# Patient Record
Sex: Male | Born: 1985 | Race: White | Hispanic: No | Marital: Married | Smoking: Never smoker
Health system: Southern US, Community
[De-identification: ages and names within clinical notes are randomized; demographics above are authoritative.]

## PROBLEM LIST (undated history)

## (undated) DIAGNOSIS — I2699 Other pulmonary embolism without acute cor pulmonale: Secondary | ICD-10-CM

## (undated) DIAGNOSIS — L03119 Cellulitis of unspecified part of limb: Secondary | ICD-10-CM

## (undated) DIAGNOSIS — G919 Hydrocephalus, unspecified: Secondary | ICD-10-CM

## (undated) DIAGNOSIS — R32 Unspecified urinary incontinence: Secondary | ICD-10-CM

## (undated) DIAGNOSIS — Q059 Spina bifida, unspecified: Secondary | ICD-10-CM

## (undated) DIAGNOSIS — Z87440 Personal history of urinary (tract) infections: Secondary | ICD-10-CM

## (undated) HISTORY — DX: Other pulmonary embolism without acute cor pulmonale: I26.99

## (undated) HISTORY — DX: Unspecified urinary incontinence: R32

## (undated) HISTORY — DX: Personal history of urinary (tract) infections: Z87.440

## (undated) HISTORY — PX: LEG SURGERY: SHX1003

## (undated) HISTORY — DX: Cellulitis of unspecified part of limb: L03.119

## (undated) HISTORY — PX: VENTRICULOPERITONEAL SHUNT: SHX204

---
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
## 2005-02-04 IMAGING — CT CT HEAD W/O CM
1 series · 16 of 30 positions shown, 20 images · non-contrast
Comparison: none

CLINICAL DATA: Follow up neurogenic bladder.  Spina bifida.  Ventricular shunt. 
 CT HEAD W/O CONTRAST
 Axial scans from the base to the vertex were performed in the unenhanced state and compared to the most recent study of 02/24/02.  There has been no change in configuration of the ventricles which are decompressed with the ventricular shunt catheter remaining.  Stable changes of partial agenesis and Chiari II malformation are again noted.  No mass effect is seen and no acute abnormality is evident.  No acute bony abnormality is seen. 
 IMPRESSION
 No change [REDACTED]ompression of ventricles with ventricular shunt catheter present.  This appearance is stable compared to the prior CT of 02/24/02.  No acute intracranial abnormality is seen.

[Series 2: brain · axial · 0.49mm/px · z∈[+160,+304]mm · 16 of 32 slices shown, 20 images]
[im 2/32  brain]
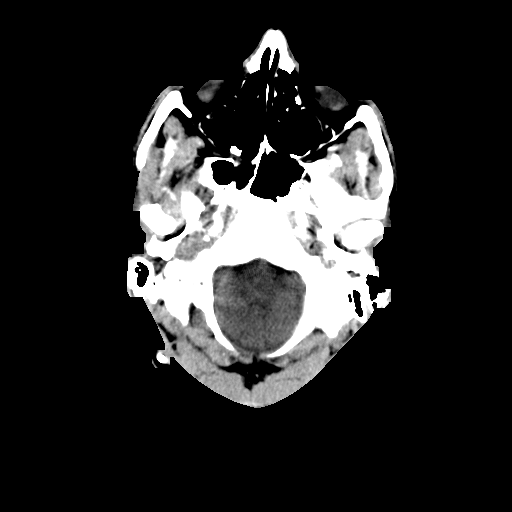
[im 2/32  bone]
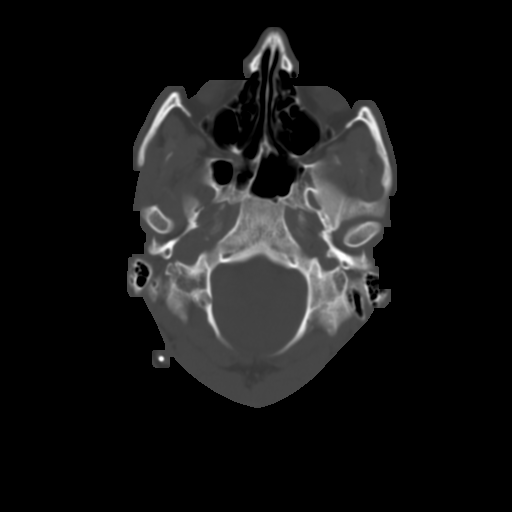
[im 4/32  brain]
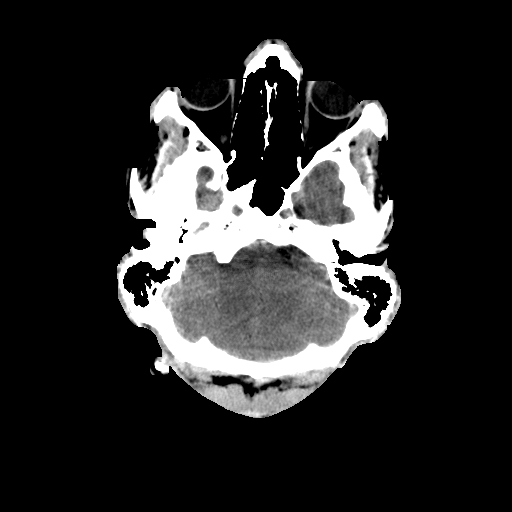
[im 6/32  brain]
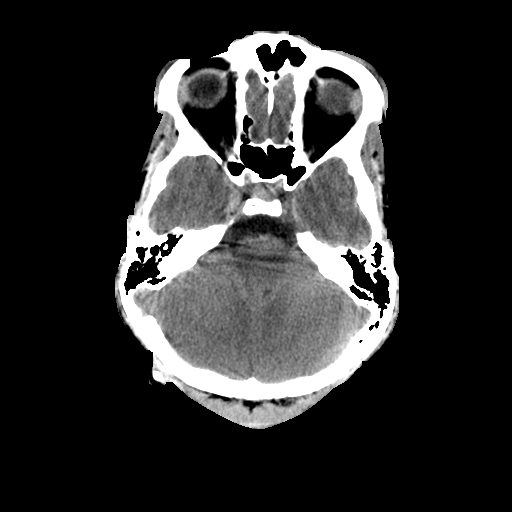
[im 8/32  brain]
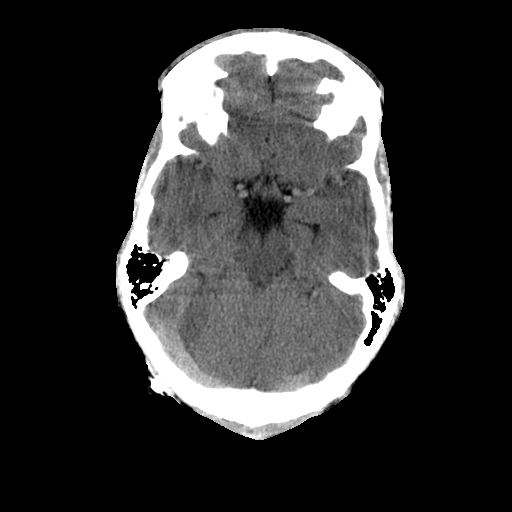
[im 9/32  brain]
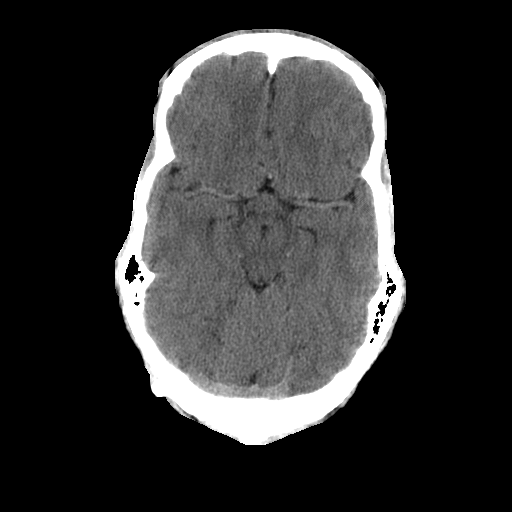
[im 9/32  bone]
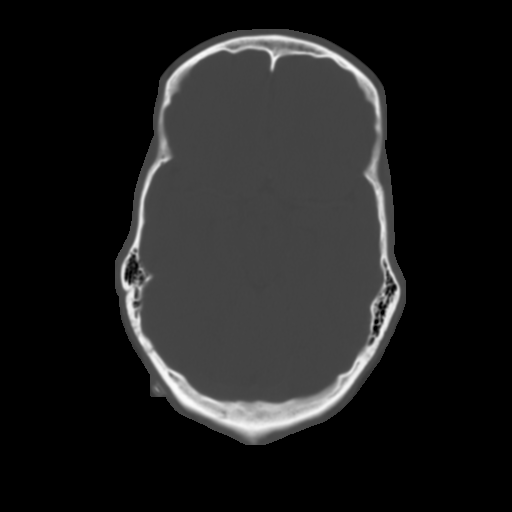
[im 11/32  brain]
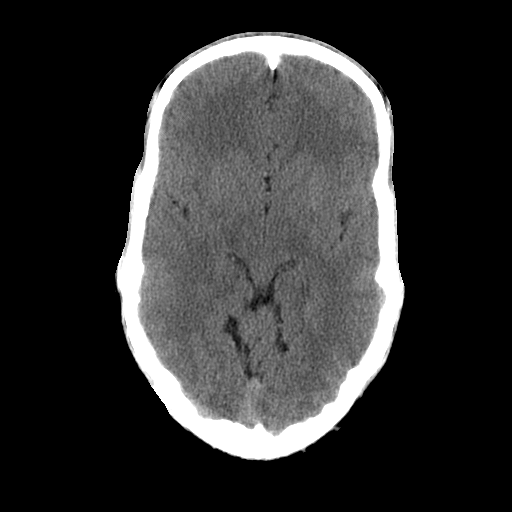
[im 13/32  brain]
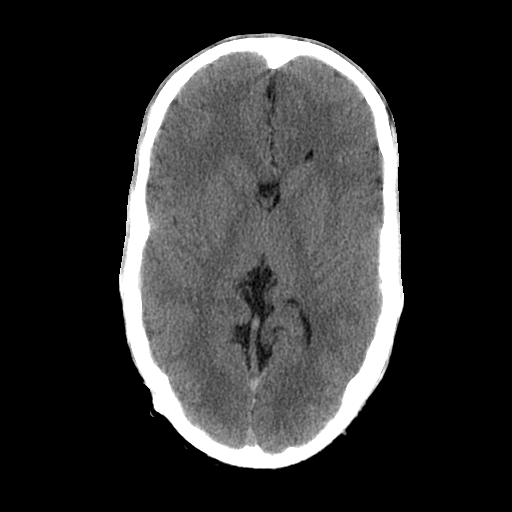
[im 15/32  brain]
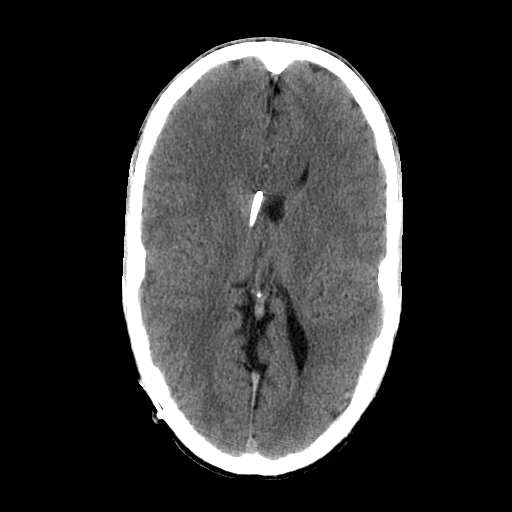
[im 17/32  brain]
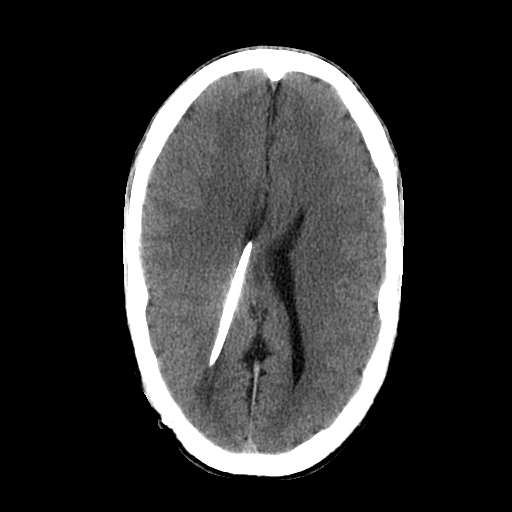
[im 17/32  bone]
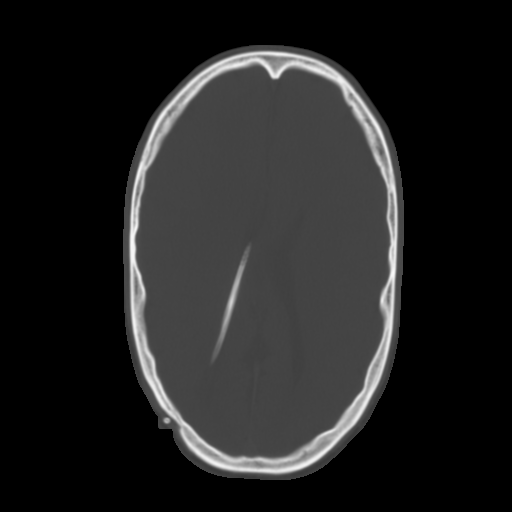
[im 19/32  brain]
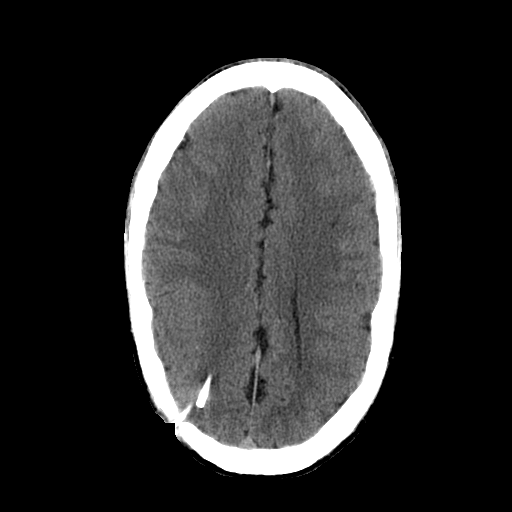
[im 21/32  brain]
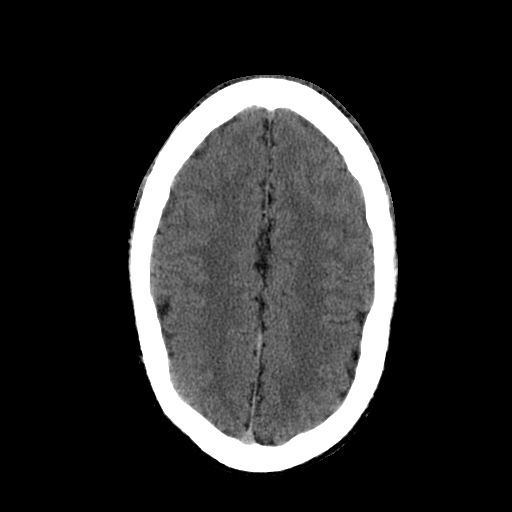
[im 23/32  brain]
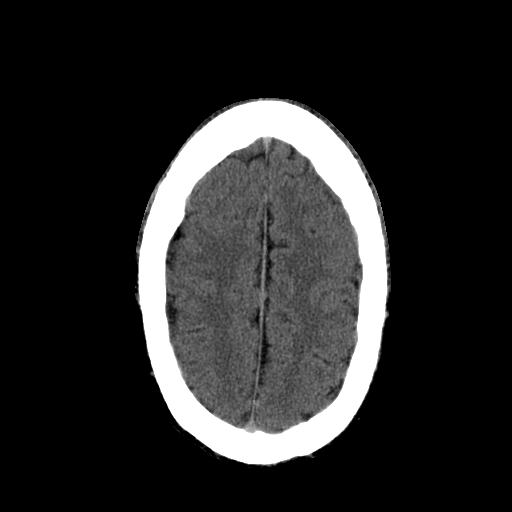
[im 24/32  brain]
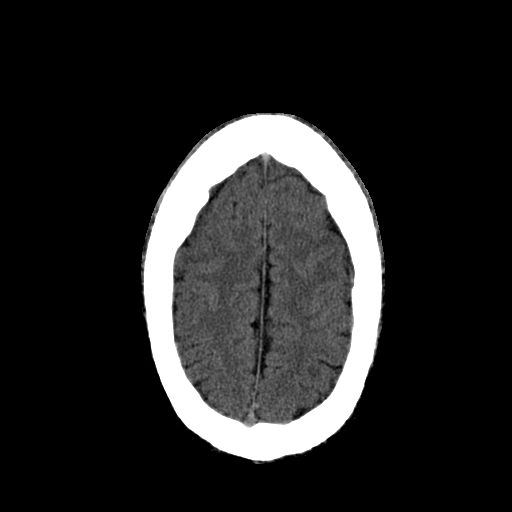
[im 24/32  bone]
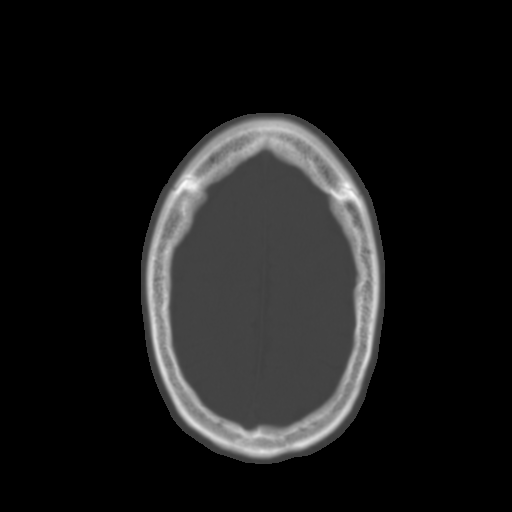
[im 26/32  brain]
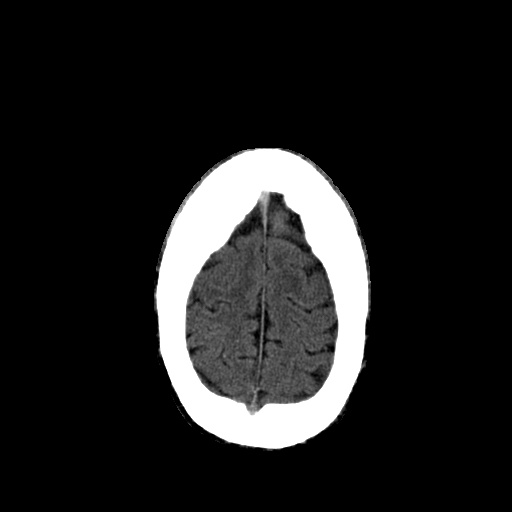
[im 28/32  brain]
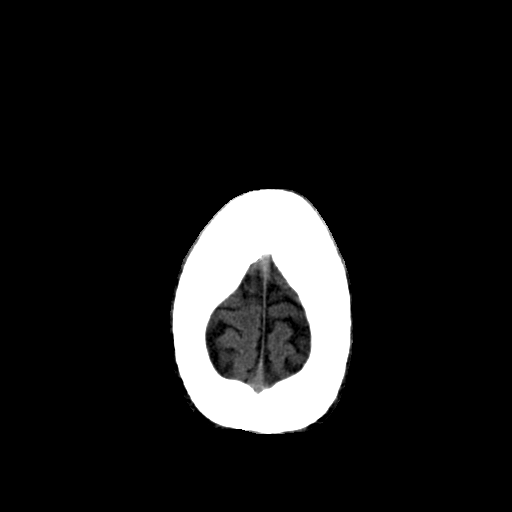
[im 30/32  brain]
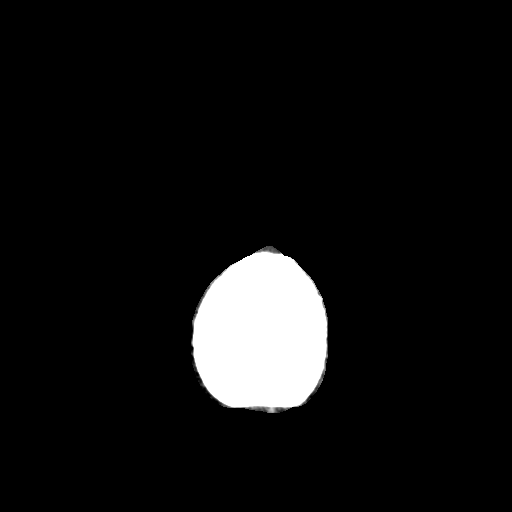

[16 of 30 positions shown; findings below may reference images not displayed]

## 2016-04-17 ENCOUNTER — Encounter: Payer: Self-pay | Admitting: Emergency Medicine

## 2016-04-17 ENCOUNTER — Inpatient Hospital Stay
Admission: EM | Admit: 2016-04-17 | Discharge: 2016-04-21 | DRG: 872 | Disposition: A | Discharge status: Other Acute Inpatient Hospital | Payer: BC Managed Care – PPO | Attending: Internal Medicine | Admitting: Internal Medicine

## 2016-04-17 ENCOUNTER — Emergency Department: Payer: BC Managed Care – PPO

## 2016-04-17 DIAGNOSIS — E871 Hypo-osmolality and hyponatremia: Secondary | ICD-10-CM | POA: Diagnosis present

## 2016-04-17 DIAGNOSIS — S81812A Laceration without foreign body, left lower leg, initial encounter: Secondary | ICD-10-CM | POA: Diagnosis present

## 2016-04-17 DIAGNOSIS — B9561 Methicillin susceptible Staphylococcus aureus infection as the cause of diseases classified elsewhere: Secondary | ICD-10-CM | POA: Diagnosis present

## 2016-04-17 DIAGNOSIS — L02416 Cutaneous abscess of left lower limb: Secondary | ICD-10-CM | POA: Diagnosis present

## 2016-04-17 DIAGNOSIS — E860 Dehydration: Secondary | ICD-10-CM | POA: Diagnosis present

## 2016-04-17 DIAGNOSIS — Q054 Unspecified spina bifida with hydrocephalus: Secondary | ICD-10-CM | POA: Diagnosis not present

## 2016-04-17 DIAGNOSIS — E876 Hypokalemia: Secondary | ICD-10-CM | POA: Diagnosis present

## 2016-04-17 DIAGNOSIS — S82409A Unspecified fracture of shaft of unspecified fibula, initial encounter for closed fracture: Secondary | ICD-10-CM | POA: Diagnosis present

## 2016-04-17 DIAGNOSIS — X58XXXA Exposure to other specified factors, initial encounter: Secondary | ICD-10-CM | POA: Diagnosis present

## 2016-04-17 DIAGNOSIS — Z882 Allergy status to sulfonamides status: Secondary | ICD-10-CM | POA: Diagnosis not present

## 2016-04-17 DIAGNOSIS — L03116 Cellulitis of left lower limb: Secondary | ICD-10-CM

## 2016-04-17 DIAGNOSIS — G629 Polyneuropathy, unspecified: Secondary | ICD-10-CM | POA: Diagnosis present

## 2016-04-17 DIAGNOSIS — A419 Sepsis, unspecified organism: Secondary | ICD-10-CM | POA: Diagnosis not present

## 2016-04-17 DIAGNOSIS — Z982 Presence of cerebrospinal fluid drainage device: Secondary | ICD-10-CM | POA: Diagnosis not present

## 2016-04-17 DIAGNOSIS — L97511 Non-pressure chronic ulcer of other part of right foot limited to breakdown of skin: Secondary | ICD-10-CM | POA: Diagnosis present

## 2016-04-17 DIAGNOSIS — M7989 Other specified soft tissue disorders: Secondary | ICD-10-CM | POA: Diagnosis not present

## 2016-04-17 DIAGNOSIS — N289 Disorder of kidney and ureter, unspecified: Secondary | ICD-10-CM | POA: Diagnosis present

## 2016-04-17 HISTORY — DX: Cellulitis of left lower limb: L03.116

## 2016-04-17 HISTORY — DX: Hydrocephalus, unspecified: G91.9

## 2016-04-17 HISTORY — DX: Spina bifida, unspecified: Q05.9

## 2016-04-17 LAB — CBC WITH DIFFERENTIAL/PLATELET
BASOS ABS: 0.1 10*3/uL (ref 0–0.1)
Basophils Relative: 0 %
EOS ABS: 0 10*3/uL (ref 0–0.7)
Eosinophils Relative: 0 %
HCT: 40.8 % (ref 40.0–52.0)
HEMOGLOBIN: 14 g/dL (ref 13.0–18.0)
LYMPHS ABS: 0.4 10*3/uL — AB (ref 1.0–3.6)
LYMPHS PCT: 1 %
MCH: 29.5 pg (ref 26.0–34.0)
MCHC: 34.2 g/dL (ref 32.0–36.0)
MCV: 86.3 fL (ref 80.0–100.0)
Monocytes Absolute: 1.3 10*3/uL — ABNORMAL HIGH (ref 0.2–1.0)
Monocytes Relative: 5 %
NEUTROS PCT: 94 %
Neutro Abs: 25.4 10*3/uL — ABNORMAL HIGH (ref 1.4–6.5)
Platelets: 423 10*3/uL (ref 150–440)
RBC: 4.73 MIL/uL (ref 4.40–5.90)
RDW: 13.9 % (ref 11.5–14.5)
WBC: 27.2 10*3/uL — AB (ref 3.8–10.6)

## 2016-04-17 LAB — COMPREHENSIVE METABOLIC PANEL
ALT: 63 U/L (ref 17–63)
AST: 97 U/L — ABNORMAL HIGH (ref 15–41)
Albumin: 3.2 g/dL — ABNORMAL LOW (ref 3.5–5.0)
Alkaline Phosphatase: 123 U/L (ref 38–126)
Anion gap: 15 (ref 5–15)
BUN: 23 mg/dL — ABNORMAL HIGH (ref 6–20)
CHLORIDE: 89 mmol/L — AB (ref 101–111)
CO2: 23 mmol/L (ref 22–32)
Calcium: 8.5 mg/dL — ABNORMAL LOW (ref 8.9–10.3)
Creatinine, Ser: 1.33 mg/dL — ABNORMAL HIGH (ref 0.61–1.24)
Glucose, Bld: 100 mg/dL — ABNORMAL HIGH (ref 65–99)
POTASSIUM: 3.3 mmol/L — AB (ref 3.5–5.1)
Sodium: 127 mmol/L — ABNORMAL LOW (ref 135–145)
TOTAL PROTEIN: 7.7 g/dL (ref 6.5–8.1)
Total Bilirubin: 2.2 mg/dL — ABNORMAL HIGH (ref 0.3–1.2)

## 2016-04-17 LAB — INFLUENZA PANEL BY PCR (TYPE A & B)
INFLBPCR: NEGATIVE
Influenza A By PCR: NEGATIVE

## 2016-04-17 LAB — GLUCOSE, CAPILLARY: GLUCOSE-CAPILLARY: 211 mg/dL — AB (ref 65–99)

## 2016-04-17 LAB — LACTIC ACID, PLASMA: LACTIC ACID, VENOUS: 1.6 mmol/L (ref 0.5–1.9)

## 2016-04-17 MED ORDER — ACETAMINOPHEN 650 MG RE SUPP: 650.0000 mg | Freq: Four times a day (QID) | RECTAL | Status: DC | PRN

## 2016-04-17 MED ORDER — ONDANSETRON HCL 4 MG/2ML IJ SOLN: 4.0000 mg | Freq: Four times a day (QID) | INTRAMUSCULAR | Status: DC | PRN

## 2016-04-17 MED ORDER — PIPERACILLIN-TAZOBACTAM 3.375 G IVPB
3.3750 g | Freq: Three times a day (TID) | INTRAVENOUS | Status: DC
  Administered 2016-04-17 – 2016-04-18 (×2): 3.375 g via INTRAVENOUS
  Filled 2016-04-17 (×2): qty 50

## 2016-04-17 MED ORDER — HEPARIN SODIUM (PORCINE) 5000 UNIT/ML IJ SOLN
5000.0000 [IU] | Freq: Three times a day (TID) | INTRAMUSCULAR | Status: DC
  Administered 2016-04-17 – 2016-04-20 (×10): 5000 [IU] via SUBCUTANEOUS
  Filled 2016-04-17 (×10): qty 1

## 2016-04-17 MED ORDER — SODIUM CHLORIDE 0.9 % IV SOLN
INTRAVENOUS | Status: DC
  Administered 2016-04-17 – 2016-04-18 (×3): via INTRAVENOUS

## 2016-04-17 MED ORDER — ACETAMINOPHEN 325 MG PO TABS
650.0000 mg | ORAL_TABLET | Freq: Four times a day (QID) | ORAL | Status: DC | PRN
  Administered 2016-04-18: 650 mg via ORAL
  Filled 2016-04-17 (×2): qty 2

## 2016-04-17 MED ORDER — ONDANSETRON HCL 4 MG PO TABS: 4.0000 mg | ORAL_TABLET | Freq: Four times a day (QID) | ORAL | Status: DC | PRN

## 2016-04-17 MED ORDER — VANCOMYCIN HCL IN DEXTROSE 750-5 MG/150ML-% IV SOLN
750.0000 mg | Freq: Two times a day (BID) | INTRAVENOUS | Status: DC
  Administered 2016-04-17: 750 mg via INTRAVENOUS
  Filled 2016-04-17 (×4): qty 150

## 2016-04-17 MED ORDER — VANCOMYCIN HCL IN DEXTROSE 1-5 GM/200ML-% IV SOLN
1000.0000 mg | Freq: Once | INTRAVENOUS | Status: AC
  Administered 2016-04-17: 1000 mg via INTRAVENOUS
  Filled 2016-04-17: qty 200

## 2016-04-17 MED ORDER — BISACODYL 5 MG PO TBEC: 5.0000 mg | DELAYED_RELEASE_TABLET | Freq: Every day | ORAL | Status: DC | PRN

## 2016-04-17 MED ORDER — SODIUM CHLORIDE 0.9 % IV BOLUS (SEPSIS)
1000.0000 mL | Freq: Once | INTRAVENOUS | Status: AC
  Administered 2016-04-17: 1000 mL via INTRAVENOUS

## 2016-04-17 MED ORDER — PIPERACILLIN-TAZOBACTAM 3.375 G IVPB 30 MIN
3.3750 g | Freq: Once | INTRAVENOUS | Status: AC
  Administered 2016-04-17: 3.375 g via INTRAVENOUS
  Filled 2016-04-17: qty 50

## 2016-04-17 MED ORDER — ACETAMINOPHEN 500 MG PO TABS
1000.0000 mg | ORAL_TABLET | Freq: Once | ORAL | Status: AC
  Administered 2016-04-17: 1000 mg via ORAL
  Filled 2016-04-17 (×2): qty 2

## 2016-04-17 NOTE — ED Triage Notes (Signed)
Pt was at work and someone noticed LLE bleeding. Wears braces on legs at baseline. Significant swelling to LLE, hot to touch on shin, foot cool to touch, bleeding now controlled.

## 2016-04-17 NOTE — Progress Notes (Addendum)
Pharmacy Antibiotic Note  Jeffrey FreezeMatthew C Janssen is a 31 y.o. male admitted on 04/17/2016 with sepsis.  Pharmacy has been consulted for Vancomycin/Zosyn dosing.   Plan: Vancomycin and Zosyn x1 given in the ED.  Vancomycin 750 IV every 12 hours.  Goal trough 15-20 mcg/mL. Zosyn 3.375g IV q8h (4 hour infusion). Vancomycin kinetics: Ke=0.067 T1/2=10.34 Vd=46  2/3 0541 - lab reports 4 of 4 bottle positive BCID staph aureus mecA not detected. SCr has improved since admission. Adjust dose to vancomycin 1 gm IV Q8H, predicted trough 16 mcg/mL. Pharmacy will continue to follow and adjust as needed to maintain trough 15 to 20 mcg/mL. Dorothia Passmore A. [place], [place].D., BCPS  Height: 5\' 7"  (170.2 cm) Weight: 170 lb (77.1 kg) IBW/kg (Calculated) : 66.1  Temp (24hrs), Avg:99.2 F (37.3 C), Min:97.7 F (36.5 C), Max:102 F (38.9 C)   Recent Labs Lab 04/17/16 1449 04/17/16 1451  WBC 27.2*  --   CREATININE 1.33*  --   LATICACIDVEN  --  1.6    Estimated Creatinine Clearance: 75.2 mL/min (by C-G formula based on SCr of 1.33 mg/dL (H)).    Allergies  Allergen Reactions  . Sulfa Antibiotics Other (See Comments)    Mind racing     Antimicrobials this admission: Vancomycin 2/2 >> Zosyn 2/2 >>  Microbiology results: 2/2 BCx: sent 2/2 Aerobic culture: sent    Thank you for allowing pharmacy to be a part of this patient's care.  Delsa BernKelly m Fuhrmann 04/17/2016 8:06 PM

## 2016-04-17 NOTE — ED Notes (Signed)
Pt states redness and swelling to L lower leg x few days, noticed today at work it was bleeding. Ankle severely swollen. Pt alert and oriented, answering questions appropriately. PT states he does not have feeling from knees down bilat d/t spina bifida. Pt wears lower leg braces. Pt has pedal pulse marked on L foot. Pitting edema noted to food and shin on L side.

## 2016-04-17 NOTE — ED Notes (Signed)
[facility]

## 2016-04-17 NOTE — ED Provider Notes (Signed)
[facility] Emergency Department Provider Note  Time seen: 3:57 PM  I have reviewed the triage vital signs and the nursing notes.   HISTORY  Chief Complaint Leg Swelling    HPI [name] is a 31 y.o. male with a past medical history of spina bifida who presents the emergency department with fever leg swelling. According to the patient at baseline he has minimal to no feeling in his lower extremities as a complication to spina bifida. Patient wears braces on the lower extremities to help with walking. For the past 4 or 5 days the patient has been febrile at home does state a mild cough, he thought he might be coming down with a cold with the flu. Today at work a Radio broadcast assistant noted blood on the patient's pants, at that time the patient noted significant swelling in the foot and ankle. Patient came to the emergency department for evaluation. Patient does have a laceration to the medial aspect of the left foot.  Past Medical History:  Diagnosis Date  . Hydrocephalus   . Spina bifida (HCC)     There are no active problems to display for this patient.   Past Surgical History:  Procedure Laterality Date  . LEG SURGERY     X 5  . VENTRICULOPERITONEAL SHUNT      Prior to Admission medications   Not on File    Allergies  Allergen Reactions  . Sulfa Antibiotics Other (See Comments)    Mind racing     History reviewed. No pertinent family history.  Social History Social History  Substance Use Topics  . Smoking status: Never Smoker  . Smokeless tobacco: Never Used  . Alcohol use Yes    Review of Systems Constitutional: Positive for fever. Negative for congestion. Cardiovascular: Negative for chest pain. Respiratory: Negative for shortness of breath. Does state occasional cough. Gastrointestinal: Negative for abdominal pain, vomiting Genitourinary: Negative for dysuria. Skin: Positive for redness and swelling to the left lower leg and  foot. Neurological: Negative for headache 10-point ROS otherwise negative.  ____________________________________________   PHYSICAL EXAM:  VITAL SIGNS: ED Triage Vitals  Enc Vitals Group     BP 04/17/16 1439 (!) 142/71     Pulse Rate 04/17/16 1439 (!) 132     Resp 04/17/16 1439 18     Temp 04/17/16 1439 (!) 102 F (38.9 C)     Temp Source 04/17/16 1439 Oral     SpO2 04/17/16 1439 100 %     Weight 04/17/16 1442 170 lb (77.1 kg)     Height 04/17/16 1442 5\' 7"  (1.702 m)     Head Circumference --      Peak Flow --      Pain Score 04/17/16 1442 3     Pain Loc --      Pain Edu? --      Excl. in GC? --     Constitutional: Alert and oriented. Well appearing and in no distress. Eyes: Normal exam ENT   Head: Normocephalic and atraumatic.   Mouth/Throat: Mucous membranes are moist. Cardiovascular: Regular rhythm and rate around 120-130 bpm. Respiratory: Normal respiratory effort without tachypnea nor retractions. Breath sounds are clear and equal bilaterally Gastrointestinal: Soft and nontender. No distention.   Musculoskeletal: Patient has a laceration approximately 4 cm in length to the medial aspect of the left lower extremity with significant surrounding edema with clear fluid leakage significant erythema Neurologic:  Normal speech and language. No gross focal neurologic deficits  Skin:  Significant erythema or swelling to the medial aspect of the left lower extremity/left ankle with lymphatic streaking proximally. No tenderness although the patient states minimal feeling and as part of his leg at baseline. Psychiatric: Mood and affect are normal. Speech and behavior are normal.   ____________________________________________  EKG reviewed and interpreted by myself shows sinus tachycardia 124 bpm, narrow QRS, normal axis, normal intervals with nonspecific ST changes without ST elevation.  RADIOLOGY  IMPRESSION: Nondisplaced transverse diaphyseal fracture left  fibula.  Marked soft tissue swelling about the left ankle could be due to dependent change or cellulitis.  Widening of the medial clear space of the ankle and syndesmosis of the distal tibia and fibula is compatible with ankle instability, age indeterminate.  ____________________________________________   INITIAL IMPRESSION / ASSESSMENT AND PLAN / ED COURSE  Pertinent labs & imaging results that were available during my care of the patient were reviewed by me and considered in my medical decision making (see chart for details).  Patient presents the emergency department likely septic from a left lower extremity cellulitis. Patient has significant swelling and the erythema to the left lower extremity/left ankle with clear drainage. Patient is febrile and tachycardic meeting sepsis criteria. Patient's white blood cell count is significantly elevated, patient is hyponatremic with no old labs for comparison. Patient's x-ray shows significant soft tissue swelling with a transverse fibula fracture. I discussed the patient with Dr. Hyacinth MeekerMiller. We will continue with IV antibiotics, IV fluids. Patient will require admission to the hospital for further treatment and given his sepsis due to left lower extremity cellulitis.  Patient has a significant leukocytosis, hyponatremia, no old records for review. Patient appears to be suffering from significant cellulitis with sepsis. We will continue with IV antibiotics. Blood pressure and vitals remained stable. Discussed the patient with Dr. Hyacinth MeekerMiller states continue with medical treatment at this time. Patient continues to be neurovascular intact distally, no sign of compartment syndrome. Patient will be admitted to the hospital for continued sepsis treatment.   CRITICAL CARE Performed by: Minna AntisPADUCHOWSKI, Atsushi Yom   Total critical care time: 30 minutes  Critical care time was exclusive of separately billable procedures and treating other patients.  Critical care was  necessary to treat or prevent imminent or life-threatening deterioration.  Critical care was time spent personally by me on the following activities: development of treatment plan with patient and/or surrogate as well as nursing, discussions with consultants, evaluation of patient's response to treatment, examination of patient, obtaining history from patient or surrogate, ordering and performing treatments and interventions, ordering and review of laboratory studies, ordering and review of radiographic studies, pulse oximetry and re-evaluation of patient's condition.   ____________________________________________   FINAL CLINICAL IMPRESSION(S) / ED DIAGNOSES  Left lower extremity cellulitis. Left fibula fracture. Sepsis    Minna AntisKevin Belenda Alviar, MD 04/17/16 [phone]

## 2016-04-17 NOTE — H&P (Signed)
[facility] - [place] at [facility]   PATIENT NAME: [name]    MR#:  [id number]  DATE OF BIRTH:  05-16-1985  DATE OF ADMISSION:  04/17/2016  PRIMARY CARE PHYSICIAN: No primary care provider on file.   REQUESTING/REFERRING PHYSICIAN:Dr.Kevin paduchowski  CHIEF COMPLAINT: Swelling and redness of left ankle    Chief Complaint  Patient presents with  . Leg Swelling    HISTORY OF PRESENT ILLNESS:  [name]  is a 31 y.o. male with a known history of Spina bifida wears leg braces every day comes in because of swelling of the left ankle, redness going on for last 4 days. Patient has no feeling in the legs because of spina bifida. Noted to have fever for the last 4 days with temps around 100 Fahrenheit and mild cough. Patient went to work today and coworker noted blood on his pants which prompted him to come to ER. In the ER patient noted to have significant swelling and redness of the left ankle. Noted have laceration on the medial aspect of the left foot.  PAST MEDICAL HISTORY:   Past Medical History:  Diagnosis Date  . Hydrocephalus   . Spina bifida (HCC)     PAST SURGICAL HISTOIRY:   Past Surgical History:  Procedure Laterality Date  . LEG SURGERY     X 5  . VENTRICULOPERITONEAL SHUNT      SOCIAL HISTORY:   Social History  Substance Use Topics  . Smoking status: Never Smoker  . Smokeless tobacco: Never Used  . Alcohol use Yes    FAMILY HISTORY:  History reviewed. No pertinent family history.  DRUG ALLERGIES:   Allergies  Allergen Reactions  . Sulfa Antibiotics Other (See Comments)    Mind racing     REVIEW OF SYSTEMS:  CONSTITUTIONAL: No fever, fatigue or weakness.  EYES: No blurred or double vision.  EARS, NOSE, AND THROAT: No tinnitus or ear pain.  RESPIRATORY: No cough, shortness of breath, wheezing or hemoptysis.  CARDIOVASCULAR: No chest pain, orthopnea, edema.  GASTROINTESTINAL: No nausea, vomiting, diarrhea or  abdominal pain.  GENITOURINARY: No dysuria, hematuria.  ENDOCRINE: No polyuria, nocturia,  HEMATOLOGY: No anemia, easy bruising or bleeding SKIN: No rash or lesion. MUSCULOSKELETAL: She has no sensation in legs because of spina bifida.   NEUROLOGIC: No tingling, numbness, weakness.  PSYCHIATRY: No anxiety or depression.   MEDICATIONS AT HOME:   Prior to Admission medications   Not on File      VITAL SIGNS:  Blood pressure (!) 146/81, pulse (!) 118, temperature 99.5 F (37.5 C), temperature source Oral, resp. rate (!) 23, height 5\' 7"  (1.702 m), weight 77.1 kg (170 lb), SpO2 98 %.  PHYSICAL EXAMINATION:  GENERAL:  31 y.o.-year-old patient lying in the bed with no acute distress.  EYES: Pupils equal, round, reactive to light and accommodation. No scleral icterus. Extraocular muscles intact.  HEENT: Head atraumatic, normocephalic. Oropharynx and nasopharynx clear.  NECK:  Supple, no jugular venous distention. No thyroid enlargement, no tenderness.  LUNGS: Normal breath sounds bilaterally, no wheezing, rales,rhonchi or crepitation. No use of accessory muscles of respiration.  CARDIOVASCULAR: S1, S2 normal. No murmurs, rubs, or gallops.  ABDOMEN: Soft, nontender, nondistended. Bowel sounds present. No organomegaly or mass.  EXTREMITIES: Patient has significant erythema, edema of  left leg. Also a laceration approximately 4 cm in length on the medial aspect of the left lower extremity, clear fluid is draining . NEUROLOGIC: Cranial nerves II through XII are intact.  Muscle strength 5/5 in all extremities. Sensation intact. Gait not checked.  PSYCHIATRIC: The patient is alert and oriented x 3.  SKIN: Erythema of the left leg, erythema. Of left leg LABORATORY PANEL:   CBC  Recent Labs Lab 04/17/16 1449  WBC 27.2*  HGB 14.0  HCT 40.8  PLT 423   ------------------------------------------------------------------------------------------------------------------  Chemistries   Recent  Labs Lab 04/17/16 1449  NA 127*  K 3.3*  CL 89*  CO2 23  GLUCOSE 100*  BUN 23*  CREATININE 1.33*  CALCIUM 8.5*  AST 97*  ALT 63  ALKPHOS 123  BILITOT 2.2*   ------------------------------------------------------------------------------------------------------------------  Cardiac Enzymes No results for input(s): TROPONINI in the last 168 hours. ------------------------------------------------------------------------------------------------------------------  RADIOLOGY:  Dg Ankle Complete Left  Result Date: 04/17/2016 CLINICAL DATA:  Lower extremity bleeding. EXAM: LEFT ANKLE COMPLETE - 3+ VIEW COMPARISON:  None. FINDINGS: The patient has an acute appearing transverse fracture of the fibula at the junction of the middle and distal thirds of the diaphysis. Healed diaphyseal fracture of the fibula with plate and screws in place is identified. There is also a healed medial malleolar fracture with fixation screws in place. Marked soft tissue swelling is present about the ankle diffusely, worse on the medial side. The subtalar joint appears autologously fused. The medial clear space and syndesmosis between the tibia and fibula appears abnormally widened. No soft tissue gas is identified. No unexpected radiopaque foreign body is seen. IMPRESSION: Nondisplaced transverse diaphyseal fracture left fibula. Marked soft tissue swelling about the left ankle could be due to dependent change or cellulitis. Widening of the medial clear space of the ankle and syndesmosis of the distal tibia and fibula is compatible with ankle instability, age indeterminate. Electronically Signed   By: Drusilla Kannerhomas  Dalessio M.D.   On: 04/17/2016 15:18    EKG:  EKG shows sinus tachycardia 1 24 bpm no ST-T changes.  IMPRESSION AND PLAN:  31 year old male patient with sepsis secondary to left leg cellulitis: Admitted to hospitalist service, started on  iv antibiotics with vancomycin, Zosyn. No evidence of abscess at this time.  Has evidence of tachycardia, leukocytosis, renal failure. #2 hyponatremia, hypokalemia secondary to sepsis and dehydration: Continue aggressive hydration, potassium supplementation.  #3. Distal fibular fracture on the left side; consult orthopedic. Has history of spina bifida,.had   Left ankle surgeryl ong time ago, D/w patient and parents.  All the records are reviewed and case discussed with ED provider. Management plans discussed with the patient, family and they are in agreement.  CODE STATUS: FULL  TOTAL TIME TAKING CARE OF THIS PATIENT: 55 minutes.    Katha HammingKONIDENA,Shadaya Marschner M.D on 04/17/2016 at 5:54 PM  Between 7am to 6pm - Pager - [phone]  After 6pm go to [email or web address] - password [email or web address] [facility]  [facility]  Office  [phone]  CC: Primary care physician; No primary care provider on file.  Note: This dictation was prepared with Dragon dictation along with smaller phrase technology. Any transcriptional errors that result from this process are unintentional.

## 2016-04-18 LAB — BLOOD CULTURE ID PANEL (REFLEXED)
Acinetobacter baumannii: NOT DETECTED
CANDIDA TROPICALIS: NOT DETECTED
Candida albicans: NOT DETECTED
Candida glabrata: NOT DETECTED
Candida krusei: NOT DETECTED
Candida parapsilosis: NOT DETECTED
ENTEROCOCCUS SPECIES: NOT DETECTED
Enterobacter cloacae complex: NOT DETECTED
Enterobacteriaceae species: NOT DETECTED
Escherichia coli: NOT DETECTED
HAEMOPHILUS INFLUENZAE: NOT DETECTED
KLEBSIELLA PNEUMONIAE: NOT DETECTED
Klebsiella oxytoca: NOT DETECTED
Listeria monocytogenes: NOT DETECTED
METHICILLIN RESISTANCE: NOT DETECTED
NEISSERIA MENINGITIDIS: NOT DETECTED
PROTEUS SPECIES: NOT DETECTED
Pseudomonas aeruginosa: NOT DETECTED
STAPHYLOCOCCUS AUREUS BCID: DETECTED — AB
STAPHYLOCOCCUS SPECIES: DETECTED — AB
STREPTOCOCCUS SPECIES: NOT DETECTED
Serratia marcescens: NOT DETECTED
Streptococcus agalactiae: NOT DETECTED
Streptococcus pneumoniae: NOT DETECTED
Streptococcus pyogenes: NOT DETECTED

## 2016-04-18 LAB — GLUCOSE, CAPILLARY
GLUCOSE-CAPILLARY: 166 mg/dL — AB (ref 65–99)
GLUCOSE-CAPILLARY: 108 mg/dL — AB (ref 65–99)
GLUCOSE-CAPILLARY: 120 mg/dL — AB (ref 65–99)
Glucose-Capillary: 124 mg/dL — ABNORMAL HIGH (ref 65–99)
Glucose-Capillary: 95 mg/dL (ref 65–99)

## 2016-04-18 LAB — CBC
HCT: 35.4 % — ABNORMAL LOW (ref 40.0–52.0)
HEMOGLOBIN: 12.2 g/dL — AB (ref 13.0–18.0)
MCH: 29.4 pg (ref 26.0–34.0)
MCHC: 34.5 g/dL (ref 32.0–36.0)
MCV: 85.2 fL (ref 80.0–100.0)
PLATELETS: 355 10*3/uL (ref 150–440)
RBC: 4.15 MIL/uL — AB (ref 4.40–5.90)
RDW: 13.8 % (ref 11.5–14.5)
WBC: 19.7 10*3/uL — AB (ref 3.8–10.6)

## 2016-04-18 LAB — MAGNESIUM: Magnesium: 2.2 mg/dL (ref 1.7–2.4)

## 2016-04-18 LAB — BASIC METABOLIC PANEL
ANION GAP: 8 (ref 5–15)
BUN: 15 mg/dL (ref 6–20)
CALCIUM: 7.4 mg/dL — AB (ref 8.9–10.3)
CHLORIDE: 99 mmol/L — AB (ref 101–111)
CO2: 25 mmol/L (ref 22–32)
CREATININE: 0.72 mg/dL (ref 0.61–1.24)
GFR calc Af Amer: >60 mL/min (ref >60)
GFR calc non Af Amer: >60 mL/min (ref >60)
Glucose, Bld: 140 mg/dL — ABNORMAL HIGH (ref 65–99)
Potassium: 2.8 mmol/L — ABNORMAL LOW (ref 3.5–5.1)
Sodium: 132 mmol/L — ABNORMAL LOW (ref 135–145)

## 2016-04-18 LAB — MRSA PCR SCREENING: MRSA by PCR: NEGATIVE

## 2016-04-18 MED ORDER — CEFAZOLIN SODIUM-DEXTROSE 2-3 GM-% IV SOLR
2.0000 g | Freq: Three times a day (TID) | INTRAVENOUS | Status: DC
  Administered 2016-04-18 – 2016-04-20 (×7): 2 g via INTRAVENOUS
  Filled 2016-04-18 (×12): qty 50

## 2016-04-18 MED ORDER — INSULIN ASPART 100 UNIT/ML ~~LOC~~ SOLN: 0.0000 [IU] | Freq: Every day | SUBCUTANEOUS | Status: DC

## 2016-04-18 MED ORDER — INSULIN ASPART 100 UNIT/ML ~~LOC~~ SOLN
0.0000 [IU] | Freq: Three times a day (TID) | SUBCUTANEOUS | Status: DC
  Filled 2016-04-18: qty 2

## 2016-04-18 MED ORDER — VANCOMYCIN HCL IN DEXTROSE 1-5 GM/200ML-% IV SOLN
1000.0000 mg | Freq: Three times a day (TID) | INTRAVENOUS | Status: DC
  Administered 2016-04-18: 1000 mg via INTRAVENOUS
  Filled 2016-04-18 (×3): qty 200

## 2016-04-18 MED ORDER — CEFAZOLIN SODIUM-DEXTROSE 2-4 GM/100ML-% IV SOLN
2.0000 g | Freq: Three times a day (TID) | INTRAVENOUS | Status: DC
  Filled 2016-04-18 (×4): qty 100

## 2016-04-18 MED ORDER — ALPRAZOLAM 0.25 MG PO TABS: 0.2500 mg | ORAL_TABLET | Freq: Two times a day (BID) | ORAL | Status: DC | PRN

## 2016-04-18 MED ORDER — SODIUM CHLORIDE 0.9 % IV SOLN
30.0000 meq | Freq: Once | INTRAVENOUS | Status: AC
  Administered 2016-04-18: 30 meq via INTRAVENOUS
  Filled 2016-04-18: qty 15

## 2016-04-18 MED ORDER — POTASSIUM CHLORIDE CRYS ER 20 MEQ PO TBCR
40.0000 meq | EXTENDED_RELEASE_TABLET | Freq: Two times a day (BID) | ORAL | Status: DC
  Administered 2016-04-18 – 2016-04-20 (×4): 40 meq via ORAL
  Filled 2016-04-18 (×5): qty 2

## 2016-04-18 NOTE — Progress Notes (Signed)
ID Brief note Notified by Connye BurkittVigilanz of + MSSA BCX. Pt with extensive cellulitis.  Rec Cont ancef Repeat bcx Will need TTE

## 2016-04-18 NOTE — Progress Notes (Signed)
[facility] [place] [facility]   PATIENT NAME: Jeffrey RobinsonsMatthew Navarro    MR#:  [id number]  DATE OF BIRTH:  05-Jul-1985  SUBJECTIVE:  CHIEF COMPLAINT:   Chief Complaint  Patient presents with  . Leg Swelling   Hx of spinabifida and VP shunt. Came with swelling on left ankle and wound. Bl cx positive for Staph.  No complains by pt.  REVIEW OF SYSTEMS:  CONSTITUTIONAL: No fever, fatigue or weakness.  EYES: No blurred or double vision.  EARS, NOSE, AND THROAT: No tinnitus or ear pain.  RESPIRATORY: No cough, shortness of breath, wheezing or hemoptysis.  CARDIOVASCULAR: No chest pain, orthopnea, edema.  GASTROINTESTINAL: No nausea, vomiting, diarrhea or abdominal pain.  GENITOURINARY: No dysuria, hematuria.  ENDOCRINE: No polyuria, nocturia,  HEMATOLOGY: No anemia, easy bruising or bleeding SKIN: No rash , have wound and some bleeding around left ankle. MUSCULOSKELETAL: No joint pain or arthritis.   NEUROLOGIC: No tingling, numbness, weakness.  PSYCHIATRY: No anxiety or depression.   ROS  DRUG ALLERGIES:   Allergies  Allergen Reactions  . Sulfa Antibiotics Other (See Comments)    Mind racing     VITALS:  Blood pressure 117/65, pulse 82, temperature 98.4 F (36.9 C), temperature source Oral, resp. rate 18, height 5\' 7"  (1.702 m), weight 74.6 kg (164 lb 8 oz), SpO2 100 %.  PHYSICAL EXAMINATION:   GENERAL:  31 y.o.-year-old patient lying in the bed with no acute distress.  EYES: Pupils equal, round, reactive to light and accommodation. No scleral icterus. Extraocular muscles intact.  HEENT: Head atraumatic, normocephalic. Oropharynx and nasopharynx clear.  NECK:  Supple, no jugular venous distention. No thyroid enlargement, no tenderness.  LUNGS: Normal breath sounds bilaterally, no wheezing, rales,rhonchi or crepitation. No use of accessory muscles of respiration.  CARDIOVASCULAR: S1, S2 normal. No murmurs, rubs, or gallops.  ABDOMEN: Soft, nontender,  nondistended. Bowel sounds present. No organomegaly or mass.  EXTREMITIES: Patient has significant erythema, edema of  left leg. Also a laceration approximately 4 cm in length on the medial aspect of the left lower extremity,redish clear fluid is draining Have both feet atrophic changes. NEUROLOGIC: Cranial nerves II through XII are intact. Muscle strength 5/5 in all extremities. Sensation intact. Gait not checked.  PSYCHIATRIC: The patient is alert and oriented x 3.  SKIN: Erythema of the left leg,  Physical Exam LABORATORY PANEL:   CBC  Recent Labs Lab 04/18/16 0424  WBC 19.7*  HGB 12.2*  HCT 35.4*  PLT 355   ------------------------------------------------------------------------------------------------------------------  Chemistries   Recent Labs Lab 04/17/16 1449 04/18/16 0424  NA 127* 132*  K 3.3* 2.8*  CL 89* 99*  CO2 23 25  GLUCOSE 100* 140*  BUN 23* 15  CREATININE 1.33* 0.72  CALCIUM 8.5* 7.4*  MG  --  2.2  AST 97*  --   ALT 63  --   ALKPHOS 123  --   BILITOT 2.2*  --    ------------------------------------------------------------------------------------------------------------------  Cardiac Enzymes No results for input(s): TROPONINI in the last 168 hours. ------------------------------------------------------------------------------------------------------------------  RADIOLOGY:  Dg Ankle Complete Left  Result Date: 04/17/2016 CLINICAL DATA:  Lower extremity bleeding. EXAM: LEFT ANKLE COMPLETE - 3+ VIEW COMPARISON:  None. FINDINGS: The patient has an acute appearing transverse fracture of the fibula at the junction of the middle and distal thirds of the diaphysis. Healed diaphyseal fracture of the fibula with plate and screws in place is identified. There is also a healed medial malleolar fracture with fixation screws in place.  Marked soft tissue swelling is present about the ankle diffusely, worse on the medial side. The subtalar joint appears  autologously fused. The medial clear space and syndesmosis between the tibia and fibula appears abnormally widened. No soft tissue gas is identified. No unexpected radiopaque foreign body is seen. IMPRESSION: Nondisplaced transverse diaphyseal fracture left fibula. Marked soft tissue swelling about the left ankle could be due to dependent change or cellulitis. Widening of the medial clear space of the ankle and syndesmosis of the distal tibia and fibula is compatible with ankle instability, age indeterminate. Electronically Signed   By: Drusilla Kanner M.D.   On: 04/17/2016 15:18    ASSESSMENT AND PLAN:   Active Problems:   Left leg cellulitis  * sepsis secondary to left leg cellulitis:    Bacteremia with staph    on  iv antibiotics with vancomycin, Zosyn. No evidence of abscess at this time. Has evidence of tachycardia, leukocytosis, renal failure.   Both blood cx- positive for staph.   MSSA, change vanc to cefepime.   Echocardiogram   ID consult, pt have VP shunt.   #2 hyponatremia, hypokalemia secondary to sepsis and dehydration: Continue aggressive hydration, potassium supplementation.   Check mg.  #3. Distal fibular fracture on the left side; consult orthopedic. Has history of spina bifida D/w patient and his mother.   All the records are reviewed and case discussed with Care Management/Social Workerr. Management plans discussed with the patient, family and they are in agreement.  CODE STATUS: Full.  TOTAL TIME TAKING CARE OF THIS PATIENT: 35 minutes.   POSSIBLE D/C IN 1-2 DAYS, DEPENDING ON CLINICAL CONDITION.   Altamese Dilling M.D on 04/18/2016   Between 7am to 6pm - Pager - [phone]  After 6pm go to [email or web address] - Social research officer, government  [facility] [place] [facility]  Office  [phone]  CC: Primary care physician; No primary care provider on file.  Note: This dictation was prepared with Dragon dictation along with smaller phrase technology. Any  transcriptional errors that result from this process are unintentional.

## 2016-04-18 NOTE — Progress Notes (Signed)
Patient ID: Jeffrey FreezeMatthew C Navarro, male   DOB: Dec 28, 1985, 31 y.o.   MRN: [id number]   Call by nursing with patient fingerstick of 211 and no known history of diabetes. Patient is admitted for sepsis and does not have any steroids ordered. Therefore I will order Accu-Cheks before meals at bedtime with regular insulin sliding scale coverage for tight glucose control. I will change his diet, controlled check hemoglobin A1c in the a.m.

## 2016-04-18 NOTE — Progress Notes (Signed)
PHARMACY - PHYSICIAN COMMUNICATION CRITICAL VALUE ALERT - BLOOD CULTURE IDENTIFICATION (BCID)  Results for orders placed or performed during the hospital encounter of 04/17/16  Blood Culture ID Panel (Reflexed) (Collected: 04/17/2016  2:49 PM)  Result Value Ref Range   Enterococcus species NOT DETECTED NOT DETECTED   Listeria monocytogenes NOT DETECTED NOT DETECTED   Staphylococcus species DETECTED (A) NOT DETECTED   Staphylococcus aureus DETECTED (A) NOT DETECTED   Methicillin resistance NOT DETECTED NOT DETECTED   Streptococcus species NOT DETECTED NOT DETECTED   Streptococcus agalactiae NOT DETECTED NOT DETECTED   Streptococcus pneumoniae NOT DETECTED NOT DETECTED   Streptococcus pyogenes NOT DETECTED NOT DETECTED   Acinetobacter baumannii NOT DETECTED NOT DETECTED   Enterobacteriaceae species NOT DETECTED NOT DETECTED   Enterobacter cloacae complex NOT DETECTED NOT DETECTED   Escherichia coli NOT DETECTED NOT DETECTED   Klebsiella oxytoca NOT DETECTED NOT DETECTED   Klebsiella pneumoniae NOT DETECTED NOT DETECTED   Proteus species NOT DETECTED NOT DETECTED   Serratia marcescens NOT DETECTED NOT DETECTED   Haemophilus influenzae NOT DETECTED NOT DETECTED   Neisseria meningitidis NOT DETECTED NOT DETECTED   Pseudomonas aeruginosa NOT DETECTED NOT DETECTED   Candida albicans NOT DETECTED NOT DETECTED   Candida glabrata NOT DETECTED NOT DETECTED   Candida krusei NOT DETECTED NOT DETECTED   Candida parapsilosis NOT DETECTED NOT DETECTED   Candida tropicalis NOT DETECTED NOT DETECTED    Name of physician (or Provider) Contacted: Dr. Tobi BastosPyreddy  Changes to prescribed antibiotics required: Continue current agents, vancomycin dose adjusted for SCr.  Carola FrostNathan A Harmonii Karle, Pharm.D., BCPS Clinical Pharmacist 04/18/2016  5:43 AM

## 2016-04-18 NOTE — Consult Note (Signed)
ORTHOPAEDIC CONSULTATION  REQUESTING PHYSICIAN: Altamese Dilling, MD  Chief Complaint: Left leg redness and swelling with bloody drainage  HPI: [name] is a 31 y.o. male who complains of  redness and swelling of the left lower leg and ankle region with some bleeding.  Patient is insensate in the lower extremities due to spina bifida and meningomyelocele.  He has had prior surgeries for fracture left medial malleolus and derotation osteotomy of the left tibia in the past.  He has been ambulatory on crutches.  He also has ankle-foot orthoses.  He was ill with flulike symptoms last week or so.  He also has had a fever for several days.  At work yesterday a coworker noticed blood on his pants and he was brought to the emergency room.  Exam and x-rays showed cellulitis and severe swelling of the left lower leg around the foot and ankle.  There was a punctate lesion medially below the medial malleolus with bloody drainage.  Patient's father is a International aid/development worker and at the bedside today.  The patient and he both agree that the redness and swelling is less intense today having been on antibiotics since yesterday.  Cultures of the blood, so far have grown methicillin sensitive staph.  White blood count today is down slightly but still elevated at 19,700.  Sodium is low at 132 and potassium is very low at 2.8.  His influenza test by PCR was negative as was his MRSA test.  temperatures are normal today but were up to 102F.  In the last 24 hours.  Past Medical History:  Diagnosis Date  . Hydrocephalus   . Spina bifida [facility])    Past Surgical History:  Procedure Laterality Date  . LEG SURGERY     X 5  . VENTRICULOPERITONEAL SHUNT     Social History   Social History  . Marital status: Single    Spouse name: N/A  . Number of children: N/A  . Years of education: N/A   Social History Main Topics  . Smoking status: Never Smoker  . Smokeless tobacco: Never Used  . Alcohol use Yes  . Drug  use: No  . Sexual activity: Not Asked   Other Topics Concern  . None   Social History Narrative  . None   History reviewed. No pertinent family history. Allergies  Allergen Reactions  . Sulfa Antibiotics Other (See Comments)    Mind racing    Prior to Admission medications   Not on File   Dg Ankle Complete Left  Result Date: 04/17/2016 CLINICAL DATA:  Lower extremity bleeding. EXAM: LEFT ANKLE COMPLETE - 3+ VIEW COMPARISON:  None. FINDINGS: The patient has an acute appearing transverse fracture of the fibula at the junction of the middle and distal thirds of the diaphysis. Healed diaphyseal fracture of the fibula with plate and screws in place is identified. There is also a healed medial malleolar fracture with fixation screws in place. Marked soft tissue swelling is present about the ankle diffusely, worse on the medial side. The subtalar joint appears autologously fused. The medial clear space and syndesmosis between the tibia and fibula appears abnormally widened. No soft tissue gas is identified. No unexpected radiopaque foreign body is seen. IMPRESSION: Nondisplaced transverse diaphyseal fracture left fibula. Marked soft tissue swelling about the left ankle could be due to dependent change or cellulitis. Widening of the medial clear space of the ankle and syndesmosis of the distal tibia and fibula is compatible with ankle instability, age indeterminate.  Electronically Signed   By: Drusilla Kannerhomas  Dalessio M.D.   On: 04/17/2016 15:18    Positive ROS: All other systems have been reviewed and were otherwise negative with the exception of those mentioned in the HPI and as above.  Physical Exam: General: Alert, no acute distress Cardiovascular: No pedal edema Respiratory: No cyanosis, no use of accessory musculature GI: No organomegaly, abdomen is soft and non-tender Skin: No lesions in the area of chief complaint Neurologic: Sensation intact distally Psychiatric: Patient is competent for  consent with normal mood and affect Lymphatic: No axillary or cervical lymphadenopathy  MUSCULOSKELETAL: Left lower leg shows significant edema in the foot and ankle of about halfway up the lower leg.  There is redness and fluctuance.  This bloody drainage through a punctate lesion below the medial malleolus.  Some bruising of the skin as well.  His cellulitis does not reach the level of skin markings that were placed yesterday.  Patient insensate.  He has healed incisions from prior surgeries.  His right leg is again a sensate, but has no swelling or tenderness  Assessment: Cellulitis, left lower leg with small medial skin lesion   Plan: Continue wound care and IV antibiotics. Sodium and potassium replacement Ambulate with PT and crutches on the right leg. No surgeries indicated at this point in time.    Valinda HoarMILLER,Brionne Mertz E, MD [phone]   04/18/2016 2:25 PM

## 2016-04-18 NOTE — Progress Notes (Addendum)
Notified MD of positive MSSA culture. Contact precautions initiated. Pt informed/educated on precautions. Pt requests medication for anxiety, reports he also has no appetite.

## 2016-04-19 LAB — GLUCOSE, CAPILLARY
Glucose-Capillary: 97 mg/dL (ref 65–99)
Glucose-Capillary: 107 mg/dL — ABNORMAL HIGH (ref 65–99)
Glucose-Capillary: 109 mg/dL — ABNORMAL HIGH (ref 65–99)
Glucose-Capillary: 124 mg/dL — ABNORMAL HIGH (ref 65–99)

## 2016-04-19 LAB — BASIC METABOLIC PANEL WITH GFR
Anion gap: 9 (ref 5–15)
BUN: 14 mg/dL (ref 6–20)
CO2: 25 mmol/L (ref 22–32)
Calcium: 7.3 mg/dL — ABNORMAL LOW (ref 8.9–10.3)
Chloride: 98 mmol/L — ABNORMAL LOW (ref 101–111)
Creatinine, Ser: 0.68 mg/dL (ref 0.61–1.24)
GFR calc Af Amer: >60 mL/min
GFR calc non Af Amer: >60 mL/min
Glucose, Bld: 100 mg/dL — ABNORMAL HIGH (ref 65–99)
Potassium: 3.1 mmol/L — ABNORMAL LOW (ref 3.5–5.1)
Sodium: 132 mmol/L — ABNORMAL LOW (ref 135–145)

## 2016-04-19 LAB — HEMOGLOBIN A1C
Hgb A1c MFr Bld: 5.3 % (ref 4.8–5.6)
Mean Plasma Glucose: 105 mg/dL

## 2016-04-19 LAB — CBC
HCT: 36.2 % — ABNORMAL LOW (ref 40.0–52.0)
Hemoglobin: 12.2 g/dL — ABNORMAL LOW (ref 13.0–18.0)
MCH: 29 pg (ref 26.0–34.0)
MCHC: 33.6 g/dL (ref 32.0–36.0)
MCV: 86.3 fL (ref 80.0–100.0)
Platelets: 384 K/uL (ref 150–440)
RBC: 4.19 MIL/uL — ABNORMAL LOW (ref 4.40–5.90)
RDW: 13.9 % (ref 11.5–14.5)
WBC: 15.7 K/uL — ABNORMAL HIGH (ref 3.8–10.6)

## 2016-04-19 MED ORDER — POTASSIUM CHLORIDE CRYS ER 20 MEQ PO TBCR: 40.0000 meq | EXTENDED_RELEASE_TABLET | Freq: Two times a day (BID) | ORAL | 0 refills | Status: DC

## 2016-04-19 MED ORDER — CEFAZOLIN SODIUM-DEXTROSE 2-3 GM-% IV SOLR: 2.0000 g | Freq: Three times a day (TID) | INTRAVENOUS | 0 refills | Status: DC

## 2016-04-19 MED ORDER — GUAIFENESIN 100 MG/5ML PO SOLN
10.0000 mL | Freq: Four times a day (QID) | ORAL | Status: DC | PRN
  Administered 2016-04-19 (×3): 200 mg via ORAL
  Filled 2016-04-19 (×4): qty 10

## 2016-04-19 NOTE — Progress Notes (Signed)
Podiatry suggested I& D. [facility]  I called [facility] transfer center- received a call back from hospitalist- Dr. Verdell Carminebrian. She accepted the pt.  [facility]  Time spent in arranging transfer 40 min.

## 2016-04-19 NOTE — Progress Notes (Signed)
Pt complaining on cough spoke with DR. Pyreddy may order Robitussin 10 ml q6h prn.

## 2016-04-19 NOTE — Discharge Summary (Signed)
[facility]   PATIENT NAME: [name]    MR#:  [id number]  DATE OF BIRTH:  23-Oct-1985  DATE OF ADMISSION:  04/17/2016 ADMITTING PHYSICIAN: Katha HammingSnehalatha Konidena, MD  DATE OF DISCHARGE: 04/19/2016  PRIMARY CARE PHYSICIAN: No primary care provider on file.    ADMISSION DIAGNOSIS:  Cellulitis of left lower extremity [L03.116] Sepsis, due to unspecified organism (HCC) [A41.9]  DISCHARGE DIAGNOSIS:  Active Problems:   Left leg cellulitis   Bacteremia with MSSA.  SECONDARY DIAGNOSIS:   Past Medical History:  Diagnosis Date  . Hydrocephalus   . Spina bifida [facility](HCC)     HOSPITAL COURSE:   * sepsis secondary to left leg cellulitis:    Bacteremia with staph ( MSSA)    on ivantibiotics with vancomycin, Zosyn. No evidence of abscess at this time. Has evidence of tachycardia, leukocytosis, renal failure.   Both blood cx ( 04/17/16)- positive for staph.   MSSA, change vanc to cefepime.   Echocardiogram awaited.   ID consult, pt have VP shunt.   Repeat cx sent ( 04/19/16). No fever in last 24 hrs.   #2 hyponatremia, hypokalemia secondary to sepsis and dehydration: Continue aggressive hydration, potassium supplementation.   Checked mg.  #3.Distal fibular fracture on the left side;consult orthopedic. Has history of spina bifida D/w patient and his mother. Podiatry suggested surgery for I& D.  As [facility], and have prosthesis in same leg on fibula- [facility].  I called transfer center- and he is accepted for transfer there.  DISCHARGE CONDITIONS:   Stable.  CONSULTS OBTAINED:  Treatment Team:  Deeann SaintHoward Miller, MD Gwyneth RevelsJustin Fowler, DPM  DRUG ALLERGIES:   Allergies  Allergen Reactions  . Sulfa Antibiotics Other (See Comments)    Mind racing     DISCHARGE MEDICATIONS:   Current Discharge Medication List    START taking these medications   Details  ceFAZolin  (ANCEF) 2-3 GM-% SOLR Inject 50 mLs (2 g total) into the vein every 8 (eight) hours. Qty: 100 mL, Refills: 0    potassium chloride SA (K-DUR,KLOR-CON) 20 MEQ tablet Take 2 tablets (40 mEq total) by mouth 2 (two) times daily. Qty: 5 tablet, Refills: 0         DISCHARGE INSTRUCTIONS:    Follow as advised by [facility].  If you experience worsening of your admission symptoms, develop shortness of breath, life threatening emergency, suicidal or homicidal thoughts you must seek medical attention immediately by calling 911 or calling your MD immediately  if symptoms less severe.  You Must read complete instructions/literature along with all the possible adverse reactions/side effects for all the Medicines you take and that have been prescribed to you. Take any new Medicines after you have completely understood and accept all the possible adverse reactions/side effects.   Please note  You were cared for by a hospitalist during your hospital stay. If you have any questions about your discharge medications or the care you received while you were in the hospital after you are discharged, you can call the unit and asked to speak with the hospitalist on call if the hospitalist that took care of you is not available. Once you are discharged, your primary care physician will handle any further medical issues. Please note that NO REFILLS for any discharge medications will be authorized once you are discharged, as it is imperative that you return to your primary care physician (or establish  a relationship with a primary care physician if you do not have one) for your aftercare needs so that they can reassess your need for medications and monitor your lab values.    Today   CHIEF COMPLAINT:   Chief Complaint  Patient presents with  . Leg Swelling    HISTORY OF PRESENT ILLNESS:  [name]  is a 31 y.o. male with a known history of Spina bifida wears leg braces every day comes in because of swelling  of the left ankle, redness going on for last 4 days. Patient has no feeling in the legs because of spina bifida. Noted to have fever for the last 4 days with temps around 100 Fahrenheit and mild cough. Patient went to work today and coworker noted blood on his pants which prompted him to come to ER. In the ER patient noted to have significant swelling and redness of the left ankle. Noted have laceration on the medial aspect of the left foot.  VITAL SIGNS:  Blood pressure 113/64, pulse 88, temperature 97.8 F (36.6 C), temperature source Oral, resp. rate 18, height 5\' 7"  (1.702 m), weight 77.1 kg (170 lb), SpO2 98 %.  I/O:   Intake/Output Summary (Last 24 hours) at 04/19/16 1331 Last data filed at 04/19/16 1006  Gross per 24 hour  Intake              240 ml  Output              450 ml  Net             -210 ml    PHYSICAL EXAMINATION:   GENERAL: 31 y.o.-year-old patient lying in the bed with no acute distress.  EYES: Pupils equal, round, reactive to light and accommodation. No scleral icterus. Extraocular muscles intact.  HEENT: Head atraumatic, normocephalic. Oropharynx and nasopharynx clear.  NECK: Supple, no jugular venous distention. No thyroid enlargement, no tenderness.  LUNGS: Normal breath sounds bilaterally, no wheezing, rales,rhonchi or crepitation. No use of accessory muscles of respiration.  CARDIOVASCULAR: S1, S2 normal. No murmurs, rubs, or gallops.  ABDOMEN: Soft, nontender, nondistended. Bowel sounds present. No organomegaly or mass.  EXTREMITIES:Patient has significant erythema, edema of left leg. Also a laceration approximately 4 cm in length on the medial aspect of the left lower extremity,redish clear fluid is draining Have both feet atrophic changes. Redness on leg is contained to the skin markings done on admission. NEUROLOGIC: Cranial nerves II through XII are intact. Muscle strength 5/5 in all extremities. Sensation intact. Gait not checked.  PSYCHIATRIC: The  patient is alert and oriented x 3.  SKIN:Erythema of the left leg,  DATA REVIEW:   CBC  Recent Labs Lab 04/19/16 0034  WBC 15.7*  HGB 12.2*  HCT 36.2*  PLT 384    Chemistries   Recent Labs Lab 04/17/16 1449 04/18/16 0424 04/19/16 0034  NA 127* 132* 132*  K 3.3* 2.8* 3.1*  CL 89* 99* 98*  CO2 23 25 25   GLUCOSE 100* 140* 100*  BUN 23* 15 14  CREATININE 1.33* 0.72 0.68  CALCIUM 8.5* 7.4* 7.3*  MG  --  2.2  --   AST 97*  --   --   ALT 63  --   --   ALKPHOS 123  --   --   BILITOT 2.2*  --   --     Cardiac Enzymes No results for input(s): TROPONINI in the last 168 hours.  Microbiology Results  Results for orders  placed or performed during the hospital encounter of 04/17/16  Blood Culture (routine x 2)     Status: Abnormal (Preliminary result)   Collection Time: 04/17/16  2:49 PM  Result Value Ref Range Status   Specimen Description BLOOD RIGHT WRIST  Final   Special Requests BOTTLES DRAWN AEROBIC AND ANAEROBIC ANA9ML AER9ML  Final   Culture  Setup Time   Final    GRAM POSITIVE COCCI IN BOTH AEROBIC AND ANAEROBIC BOTTLES CRITICAL RESULT CALLED TO, READ BACK BY AND VERIFIED WITH: NATE COOKSON AT 1191 04/18/16.PMH CONFIRMED BY [facility]    Culture (A)  Final    STAPHYLOCOCCUS AUREUS SUSCEPTIBILITIES TO FOLLOW Performed at [facility], [address] [place], [place] [address]    Report Status PENDING  Incomplete  Blood Culture ID Panel (Reflexed)     Status: Abnormal   Collection Time: 04/17/16  2:49 PM  Result Value Ref Range Status   Enterococcus species NOT DETECTED NOT DETECTED Final   Listeria monocytogenes NOT DETECTED NOT DETECTED Final   Staphylococcus species DETECTED (A) NOT DETECTED Final    Comment: CRITICAL RESULT CALLED TO, READ BACK BY AND VERIFIED WITH: NATE COOKSON AT 5621 04/18/16.PMH    Staphylococcus aureus DETECTED (A) NOT DETECTED Final    Comment: Methicillin (oxacillin) susceptible Staphylococcus aureus (MSSA). Preferred therapy is  anti staphylococcal beta lactam antibiotic (Cefazolin or Nafcillin), unless clinically contraindicated. CRITICAL RESULT CALLED TO, READ BACK BY AND VERIFIED WITH: NATE COOKSON AT 3086 04/18/16.PMH    Methicillin resistance NOT DETECTED NOT DETECTED Final   Streptococcus species NOT DETECTED NOT DETECTED Final   Streptococcus agalactiae NOT DETECTED NOT DETECTED Final   Streptococcus pneumoniae NOT DETECTED NOT DETECTED Final   Streptococcus pyogenes NOT DETECTED NOT DETECTED Final   Acinetobacter baumannii NOT DETECTED NOT DETECTED Final   Enterobacteriaceae species NOT DETECTED NOT DETECTED Final   Enterobacter cloacae complex NOT DETECTED NOT DETECTED Final   Escherichia coli NOT DETECTED NOT DETECTED Final   Klebsiella oxytoca NOT DETECTED NOT DETECTED Final   Klebsiella pneumoniae NOT DETECTED NOT DETECTED Final   Proteus species NOT DETECTED NOT DETECTED Final   Serratia marcescens NOT DETECTED NOT DETECTED Final   Haemophilus influenzae NOT DETECTED NOT DETECTED Final   Neisseria meningitidis NOT DETECTED NOT DETECTED Final   Pseudomonas aeruginosa NOT DETECTED NOT DETECTED Final   Candida albicans NOT DETECTED NOT DETECTED Final   Candida glabrata NOT DETECTED NOT DETECTED Final   Candida krusei NOT DETECTED NOT DETECTED Final   Candida parapsilosis NOT DETECTED NOT DETECTED Final   Candida tropicalis NOT DETECTED NOT DETECTED Final  Blood Culture (routine x 2)     Status: Abnormal (Preliminary result)   Collection Time: 04/17/16  3:01 PM  Result Value Ref Range Status   Specimen Description BLOOD LEFT ASSIST CONTROL  Final   Special Requests   Final    BOTTLES DRAWN AEROBIC AND ANAEROBIC AER10ML ANA12ML   Culture  Setup Time   Final    GRAM POSITIVE COCCI IN BOTH AEROBIC AND ANAEROBIC BOTTLES CRITICAL RESULT CALLED TO, READ BACK BY AND VERIFIED WITH: NATE COOKSON AT 5784 04/18/16.PMH CONFIRMED BY [facility]    Culture STAPHYLOCOCCUS AUREUS (A)  Final   Report Status PENDING   Incomplete  Aerobic Culture (superficial specimen)     Status: None (Preliminary result)   Collection Time: 04/17/16  3:49 PM  Result Value Ref Range Status   Specimen Description LEG LEFT LOWER  Final   Special Requests NONE  Final   Gram Stain   Final    FEW WBC PRESENT, PREDOMINANTLY PMN RARE GRAM POSITIVE COCCI IN CLUSTERS Performed at [facility], [address], [place], [place] [address]    Culture MODERATE STAPHYLOCOCCUS AUREUS  Final   Report Status PENDING  Incomplete   Organism ID, Bacteria STAPHYLOCOCCUS AUREUS  Final      Susceptibility   Staphylococcus aureus - MIC*    CIPROFLOXACIN <=0.5 SENSITIVE Sensitive     ERYTHROMYCIN <=0.25 SENSITIVE Sensitive     GENTAMICIN <=0.5 SENSITIVE Sensitive     OXACILLIN <=0.25 SENSITIVE Sensitive     TETRACYCLINE <=1 SENSITIVE Sensitive     VANCOMYCIN <=0.5 SENSITIVE Sensitive     TRIMETH/SULFA <=10 SENSITIVE Sensitive     CLINDAMYCIN <=0.25 SENSITIVE Sensitive     RIFAMPIN <=0.5 SENSITIVE Sensitive     Inducible Clindamycin NEGATIVE Sensitive     * MODERATE STAPHYLOCOCCUS AUREUS  MRSA PCR Screening     Status: None   Collection Time: 04/18/16  6:30 AM  Result Value Ref Range Status   MRSA by PCR NEGATIVE NEGATIVE Final    Comment:        The GeneXpert MRSA Assay (FDA approved for NASAL specimens only), is one component of a comprehensive MRSA colonization surveillance program. It is not intended to diagnose MRSA infection nor to guide or monitor treatment for MRSA infections.   CULTURE, BLOOD (ROUTINE X 2) w Reflex to ID Panel     Status: None (Preliminary result)   Collection Time: 04/19/16 12:34 AM  Result Value Ref Range Status   Specimen Description BLOOD ARM LEFT UPPER  Final   Special Requests BOTTLES DRAWN AEROBIC AND ANAEROBIC 8CCAERO,6CCANA  Final   Culture NO GROWTH < 12 HOURS  Final   Report Status PENDING  Incomplete  CULTURE, BLOOD (ROUTINE X 2) w Reflex to ID Panel     Status: None (Preliminary  result)   Collection Time: 04/19/16 12:34 AM  Result Value Ref Range Status   Specimen Description BLOOD ARM LEFT LOWER  Final   Special Requests BOTTLES DRAWN AEROBIC AND ANAEROBIC 2CCAERO,1CCANA  Final   Culture NO GROWTH < 12 HOURS  Final   Report Status PENDING  Incomplete    RADIOLOGY:  Dg Ankle Complete Left  Result Date: 04/17/2016 CLINICAL DATA:  Lower extremity bleeding. EXAM: LEFT ANKLE COMPLETE - 3+ VIEW COMPARISON:  None. FINDINGS: The patient has an acute appearing transverse fracture of the fibula at the junction of the middle and distal thirds of the diaphysis. Healed diaphyseal fracture of the fibula with plate and screws in place is identified. There is also a healed medial malleolar fracture with fixation screws in place. Marked soft tissue swelling is present about the ankle diffusely, worse on the medial side. The subtalar joint appears autologously fused. The medial clear space and syndesmosis between the tibia and fibula appears abnormally widened. No soft tissue gas is identified. No unexpected radiopaque foreign body is seen. IMPRESSION: Nondisplaced transverse diaphyseal fracture left fibula. Marked soft tissue swelling about the left ankle could be due to dependent change or cellulitis. Widening of the medial clear space of the ankle and syndesmosis of the distal tibia and fibula is compatible with ankle instability, age indeterminate. Electronically Signed   By: Drusilla Kanner M.D.   On: 04/17/2016 15:18    EKG:  No orders found for this or any previous visit.    Management plans discussed with the patient, family and they are in  agreement.  CODE STATUS:     Code Status Orders        Start     Ordered   04/17/16 1738  Full code  Continuous     04/17/16 1739    Code Status History    Date Active Date Inactive Code Status Order ID Comments User Context   This patient has a current code status but no historical code status.      TOTAL TIME TAKING CARE  OF THIS PATIENT: 55 minutes.    Altamese Dilling M.D on 04/19/2016 at 1:31 PM  Between 7am to 6pm - Pager - [phone]  After 6pm go to [email or web address] - Social research officer, government  [facility] [place] [facility]  Office  [phone]  CC: Primary care physician; No primary care provider on file.   Note: This dictation was prepared with Dragon dictation along with smaller phrase technology. Any transcriptional errors that result from this process are unintentional.

## 2016-04-19 NOTE — Consult Note (Signed)
ORTHOPAEDIC CONSULTATION  REQUESTING PHYSICIAN: Altamese Dilling, MD  Chief Complaint: Right toe ulcer and left ankle infection  HPI: [name] is Navarro 31 y.o. male who complains of  Left ankle infection.  Noted over last few days.  Bloody drainage on ankle with severe cellulitis and admitted for abx.    HPI from [name] is Navarro 31 y.o. male with Navarro past medical history of spina bifida who presents the emergency department with fever leg swelling. According to the patient at baseline he has minimal to no feeling in his lower extremities as Navarro complication to spina bifida. Patient wears braces on the lower extremities to help with walking. For the past 4 or 5 days the patient has been febrile at home does state Navarro mild cough, he thought he might be coming down with Navarro cold with the flu. 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 Patient came to the emergency department for evaluation. Patient does have Navarro laceration to the medial aspect of the left foot.  Past Medical History:  Diagnosis Date  . Hydrocephalus   . Spina bifida [facility])    Past Surgical History:  Procedure Laterality Date  . LEG SURGERY     X 5  . VENTRICULOPERITONEAL SHUNT     Social History   Social History  . Marital status: Single    Spouse name: N/Navarro  . Number of children: N/Navarro  . Years of education: N/Navarro   Social History Main Topics  . Smoking status: Never Smoker  . Smokeless tobacco: Never Used  . Alcohol use Yes  . Drug use: No  . Sexual activity: Not Asked   Other Topics Concern  . None   Social History Narrative  . None   History reviewed. No pertinent family history. Allergies  Allergen Reactions  . Sulfa Antibiotics Other (See Comments)    Mind racing    Prior to Admission medications   Not on File   Dg Ankle Complete Left  Result Date: 04/17/2016 CLINICAL DATA:  Lower extremity  bleeding. EXAM: LEFT ANKLE COMPLETE - 3+ VIEW COMPARISON:  None. FINDINGS: The patient has an acute appearing transverse fracture of the fibula at the junction of the middle and distal thirds of the diaphysis. Healed diaphyseal fracture of the fibula with plate and screws in place is identified. There is also Navarro healed medial malleolar fracture with fixation screws in place. Marked soft tissue swelling is present about the ankle diffusely, worse on the medial side. The subtalar joint appears autologously fused. The medial clear space and syndesmosis between the tibia and fibula appears abnormally widened. No soft tissue gas is identified. No unexpected radiopaque foreign body is seen. IMPRESSION: Nondisplaced transverse diaphyseal fracture left fibula. Marked soft tissue swelling about the left ankle could be due to dependent change or cellulitis. Widening of the medial clear space of the ankle and syndesmosis of the distal tibia and fibula is compatible with ankle instability, age indeterminate. Electronically Signed   By: Drusilla Kanner M.D.   On: 04/17/2016 15:18    Positive ROS: All other systems have been reviewed and were otherwise negative with the exception of those mentioned in the HPI and as above.  12 point ROS was performed.  Physical Exam: General: Alert and oriented.  No apparent distress.  Vascular:  Left foot:Dorsalis Pedis:  diminished Posterior Tibial:  diminished secondary to edema  Right  foot: Dorsalis Pedis:  present Posterior Tibial:  present  Neuro:absent light touch to foot and ankle.  Intact from mid-tibia and proximal  Derm:Noted severe erythema to medial ankle with open draining abscess with purulence.  Culture taken and wound probes deep to medial ankle region. Area of superficial eschar on posterior medial ankle overlying fluctuance.  Erythema extends to proximal tibial region.  On right foot lesser toe small superficial ulceration on tip of toe.  Debridment of 1cm  ulceration limitied to skin and dermis.  Minimal drainage.   Ortho/MS: Noted diffuse edema to left lower leg.  Valgus position of foot to ankle is chronic.  Soft tissue boggy/fluctuance to posterior medial ankle.   Assessment: Left medial ankle abscess Right 3rd toe ulceration Spina bifida with neuropathy  Plan: I've recommended I & D of ankle.  Pt was hesitant but will plan today.  NPO for now.  Pt ate at 7 am.  Pt and family [facility] and complex medical history.  Will keep NPO for now until transfer has been put in place and can then decide status.      [name], DPM Cell [phone]   04/19/2016 10:57 AM

## 2016-04-19 NOTE — Progress Notes (Signed)
Pt complaining of cough message sent to prime doc.

## 2016-04-19 NOTE — Progress Notes (Signed)
[facility] MD paged to notify.

## 2016-04-19 NOTE — Progress Notes (Signed)
[employer] [facility] [facility] [place] [facility]   PATIENT NAME: [name]    MR#:  [id number]  DATE OF BIRTH:  1986/01/11  SUBJECTIVE:  CHIEF COMPLAINT:   Chief Complaint  Patient presents with  . Leg Swelling   Hx of spinabifida and VP shunt. Came with swelling on left ankle and wound. Bl cx positive for Staph. MSSA,   No complains by pt. No fever in last 24 hrs.  REVIEW OF SYSTEMS:  CONSTITUTIONAL: No fever, fatigue or weakness.  EYES: No blurred or double vision.  EARS, NOSE, AND THROAT: No tinnitus or ear pain.  RESPIRATORY: No cough, shortness of breath, wheezing or hemoptysis.  CARDIOVASCULAR: No chest pain, orthopnea, edema.  GASTROINTESTINAL: No nausea, vomiting, diarrhea or abdominal pain.  GENITOURINARY: No dysuria, hematuria.  ENDOCRINE: No polyuria, nocturia,  HEMATOLOGY: No anemia, easy bruising or bleeding SKIN: No rash , have wound and some bleeding around left ankle. MUSCULOSKELETAL: No joint pain or arthritis.   NEUROLOGIC: No tingling, numbness, weakness.  PSYCHIATRY: No anxiety or depression.   ROS  DRUG ALLERGIES:   Allergies  Allergen Reactions  . Sulfa Antibiotics Other (See Comments)    Mind racing     VITALS:  Blood pressure 128/62, pulse 97, temperature 98.8 F (37.1 C), temperature source Oral, resp. rate 18, height 5\' 7"  (1.702 m), weight 77.1 kg (170 lb), SpO2 97 %.  PHYSICAL EXAMINATION:   GENERAL:  31 y.o.-year-old patient lying in the bed with no acute distress.  EYES: Pupils equal, round, reactive to light and accommodation. No scleral icterus. Extraocular muscles intact.  HEENT: Head atraumatic, normocephalic. Oropharynx and nasopharynx clear.  NECK:  Supple, no jugular venous distention. No thyroid enlargement, no tenderness.  LUNGS: Normal breath sounds bilaterally, no wheezing, rales,rhonchi or crepitation. No use of accessory muscles of respiration.  CARDIOVASCULAR: S1, S2 normal. No murmurs, rubs, or gallops.   ABDOMEN: Soft, nontender, nondistended. Bowel sounds present. No organomegaly or mass.  EXTREMITIES: Patient has significant erythema, edema of  left leg. Also a laceration approximately 4 cm in length on the medial aspect of the left lower extremity,redish clear fluid is draining Have both feet atrophic changes. Redness on leg is contained to the skin markings done on admission. NEUROLOGIC: Cranial nerves II through XII are intact. Muscle strength 5/5 in all extremities. Sensation intact. Gait not checked.  PSYCHIATRIC: The patient is alert and oriented x 3.  SKIN: Erythema of the left leg,  Physical Exam LABORATORY PANEL:   CBC  Recent Labs Lab 04/19/16 0034  WBC 15.7*  HGB 12.2*  HCT 36.2*  PLT 384   ------------------------------------------------------------------------------------------------------------------  Chemistries   Recent Labs Lab 04/17/16 1449 04/18/16 0424 04/19/16 0034  NA 127* 132* 132*  K 3.3* 2.8* 3.1*  CL 89* 99* 98*  CO2 23 25 25   GLUCOSE 100* 140* 100*  BUN 23* 15 14  CREATININE 1.33* 0.72 0.68  CALCIUM 8.5* 7.4* 7.3*  MG  --  2.2  --   AST 97*  --   --   ALT 63  --   --   ALKPHOS 123  --   --   BILITOT 2.2*  --   --    ------------------------------------------------------------------------------------------------------------------  Cardiac Enzymes No results for input(s): TROPONINI in the last 168 hours. ------------------------------------------------------------------------------------------------------------------  RADIOLOGY:  Dg Ankle Complete Left  Result Date: 04/17/2016 CLINICAL DATA:  Lower extremity bleeding. EXAM: LEFT ANKLE COMPLETE - 3+ VIEW COMPARISON:  None. FINDINGS: The patient has an acute appearing transverse  fracture of the fibula at the junction of the middle and distal thirds of the diaphysis. Healed diaphyseal fracture of the fibula with plate and screws in place is identified. There is also a healed medial  malleolar fracture with fixation screws in place. Marked soft tissue swelling is present about the ankle diffusely, worse on the medial side. The subtalar joint appears autologously fused. The medial clear space and syndesmosis between the tibia and fibula appears abnormally widened. No soft tissue gas is identified. No unexpected radiopaque foreign body is seen. IMPRESSION: Nondisplaced transverse diaphyseal fracture left fibula. Marked soft tissue swelling about the left ankle could be due to dependent change or cellulitis. Widening of the medial clear space of the ankle and syndesmosis of the distal tibia and fibula is compatible with ankle instability, age indeterminate. Electronically Signed   By: Drusilla Kannerhomas  Dalessio M.D.   On: 04/17/2016 15:18    ASSESSMENT AND PLAN:   Active Problems:   Left leg cellulitis  * sepsis secondary to left leg cellulitis:    Bacteremia with staph    on  iv antibiotics with vancomycin, Zosyn. No evidence of abscess at this time. Has evidence of tachycardia, leukocytosis, renal failure.   Both blood cx- positive for staph.   MSSA, change vanc to cefepime.   Echocardiogram   ID consult, pt have VP shunt.   Repeat cx sent.   #2 hyponatremia, hypokalemia secondary to sepsis and dehydration: Continue aggressive hydration, potassium supplementation.   Checked mg.  #3. Distal fibular fracture on the left side; consult orthopedic. Has history of spina bifida D/w patient and his mother.  No need for surgery as per ortho.  Will get Podiatry and wound care team for further wound care.   All the records are reviewed and case discussed with Care Management/Social Workerr. Management plans discussed with the patient, family and they are in agreement.  CODE STATUS: Full.  TOTAL TIME TAKING CARE OF THIS PATIENT: 35 minutes.   POSSIBLE D/C IN 1-2 DAYS, DEPENDING ON CLINICAL CONDITION.   Altamese DillingVACHHANI, Tynia Wiers M.D on 04/19/2016   Between 7am to 6pm - Pager -  [phone]  After 6pm go to [email or web address] - Social research officer, governmentpassword EPAS [email or web address]  [facility]  Office  [phone]  CC: Primary care physician; No primary care provider on file.  Note: This dictation was prepared with Dragon dictation along with smaller phrase technology. Any transcriptional errors that result from this process are unintentional.

## 2016-04-19 NOTE — Discharge Instructions (Signed)
Follow up as advised by [facility].

## 2016-04-19 NOTE — Progress Notes (Signed)
Pt with potassium of 3.1  This morning. Spoke with Dr. Tobi BastosPyreddy may give morning dose now.

## 2016-04-20 ENCOUNTER — Inpatient Hospital Stay
Admit: 2016-04-20 | Discharge: 2016-04-20 | Disposition: A | Discharge status: Home / Self Care | Payer: BC Managed Care – PPO | Attending: Internal Medicine | Admitting: Internal Medicine

## 2016-04-20 ENCOUNTER — Inpatient Hospital Stay: Admit: 2016-04-20 | Payer: BC Managed Care – PPO

## 2016-04-20 LAB — CULTURE, BLOOD (ROUTINE X 2)

## 2016-04-20 LAB — GLUCOSE, CAPILLARY: Glucose-Capillary: 89 mg/dL (ref 65–99)

## 2016-04-20 LAB — AEROBIC CULTURE W GRAM STAIN (SUPERFICIAL SPECIMEN)

## 2016-04-20 LAB — AEROBIC CULTURE  (SUPERFICIAL SPECIMEN)

## 2016-04-20 NOTE — Consult Note (Signed)
[facility] Infectious Disease     Reason for Consult:MSSA BACTEREMIA    Referring Physician: Dolores Frame Date of Admission:  04/17/2016   Active Problems:   Left leg cellulitis   HPI: [name] is a 31 y.o. male with spina bifida admitted with increased L ankle swelling and bleeding. He usually wears a brace on his lgs and has limited sensation there. On admit temp 102, wbc 19. BCX and wound cx + MSSA. Started on vanco and zosyn and now on ancef. Fu bcx neg. Fevers improved, wbd down to 15 several days ago. Pt is active, works at [employer]. Has not had surgery on his legs since around age 40 but does have a plate and screws in L ankle.   Past Medical History:  Diagnosis Date  . Hydrocephalus   . Spina bifida [facility])    Past Surgical History:  Procedure Laterality Date  . LEG SURGERY     X 5  . VENTRICULOPERITONEAL SHUNT     Social History  Substance Use Topics  . Smoking status: Never Smoker  . Smokeless tobacco: Never Used  . Alcohol use Yes   History reviewed. No pertinent family history.  Allergies:  Allergies  Allergen Reactions  . Sulfa Antibiotics Other (See Comments)    Mind racing     Current antibiotics: Antibiotics Given (last 72 hours)    Date/Time Action Medication Dose Rate   04/17/16 2221 Given   vancomycin (VANCOCIN) IVPB 750 mg/150 ml premix 750 mg 150 mL/hr   04/17/16 2300 Given   piperacillin-tazobactam (ZOSYN) IVPB 3.375 g 3.375 g 12.5 mL/hr   04/18/16 0617 Given   piperacillin-tazobactam (ZOSYN) IVPB 3.375 g 3.375 g 12.5 mL/hr   04/18/16 0617 Given   vancomycin (VANCOCIN) IVPB 1000 mg/200 mL premix 1,000 mg 200 mL/hr   04/18/16 1903 Given   ceFAZolin (ANCEF) IVPB 2 g/50 mL premix 2 g 100 mL/hr   04/19/16 0543 Given   ceFAZolin (ANCEF) IVPB 2 g/50 mL premix 2 g 100 mL/hr   04/19/16 1501 Given   ceFAZolin (ANCEF) IVPB 2 g/50 mL premix 2 g 100 mL/hr   04/19/16 2140 Given   ceFAZolin (ANCEF) IVPB 2 g/50 mL premix 2 g 100 mL/hr    04/20/16 0525 Given   ceFAZolin (ANCEF) IVPB 2 g/50 mL premix 2 g 100 mL/hr   04/20/16 1418 Given   ceFAZolin (ANCEF) IVPB 2 g/50 mL premix 2 g 100 mL/hr      MEDICATIONS: . ceFAZolin  2 g Intravenous Q8H  . heparin  5,000 Units Subcutaneous Q8H  . insulin aspart  0-15 Units Subcutaneous TID WC  . insulin aspart  0-5 Units Subcutaneous QHS    Review of Systems - 11 systems reviewed and negative per HPI   OBJECTIVE: Temp:  [97.5 F (36.4 C)-99.6 F (37.6 C)] 97.5 F (36.4 C) (02/05 1500) Pulse Rate:  [80-99] 98 (02/05 1500) Resp:  [16-19] 17 (02/05 0807) BP: (113-141)/(52-72) 133/61 (02/05 1500) SpO2:  [98 %-100 %] 98 % (02/05 1500) Physical Exam  Constitutional: He is oriented to person, place, and time. He appears well-developed and well-nourished. No distress.  HENT: anicteric  Mouth/Throat: Oropharynx is clear and moist. No oropharyngeal exudate.  Cardiovascular: Normal rate, regular rhythm and normal heart sounds. Exam reveals no gallop and no friction rub.  No murmur heard.  Pulmonary/Chest: Effort normal and breath sounds normal. No respiratory distress. He has no wheezes.  Abdominal: Soft. Bowel sounds are normal. He exhibits no distension. There  is no tenderness.  Lymphadenopathy: He has no cervical adenopathy.  Neurological: He is alert and oriented to person, place, and time.  Skin: L medial ankle with large area of induration, bogginess and drainage from a dime sized wound L foot with marked swelling, deformity medially around ankle.  Psychiatric: He has a normal mood and affect. His behavior is normal.     LABS: Results for orders placed or performed during the hospital encounter of 04/17/16 (from the past 48 hour(s))  Glucose, capillary     Status: None   Collection Time: 04/18/16  9:10 PM  Result Value Ref Range   Glucose-Capillary 95 65 - 99 mg/dL  Basic metabolic panel     Status: Abnormal   Collection Time: 04/19/16 12:34 AM  Result Value Ref Range    Sodium 132 (L) 135 - 145 mmol/L   Potassium 3.1 (L) 3.5 - 5.1 mmol/L   Chloride 98 (L) 101 - 111 mmol/L   CO2 25 22 - 32 mmol/L   Glucose, Bld 100 (H) 65 - 99 mg/dL   BUN 14 6 - 20 mg/dL   Creatinine, Ser 0.68 0.61 - 1.24 mg/dL   Calcium 7.3 (L) 8.9 - 10.3 mg/dL   GFR calc non Af Amer >60 >60 mL/min   GFR calc Af Amer >60 >60 mL/min    Comment: (NOTE) The eGFR has been calculated using the CKD EPI equation. This calculation has not been validated in all clinical situations. eGFR's persistently <60 mL/min signify possible Chronic Kidney Disease.    Anion gap 9 5 - 15  CBC     Status: Abnormal   Collection Time: 04/19/16 12:34 AM  Result Value Ref Range   WBC 15.7 (H) 3.8 - 10.6 K/uL   RBC 4.19 (L) 4.40 - 5.90 MIL/uL   Hemoglobin 12.2 (L) 13.0 - 18.0 g/dL   HCT 36.2 (L) 40.0 - 52.0 %   MCV 86.3 80.0 - 100.0 fL   MCH 29.0 26.0 - 34.0 pg   MCHC 33.6 32.0 - 36.0 g/dL   RDW 13.9 11.5 - 14.5 %   Platelets 384 150 - 440 K/uL  CULTURE, BLOOD (ROUTINE X 2) w Reflex to ID Panel     Status: None (Preliminary result)   Collection Time: 04/19/16 12:34 AM  Result Value Ref Range   Specimen Description BLOOD ARM LEFT UPPER    Special Requests BOTTLES DRAWN AEROBIC AND ANAEROBIC 8CCAERO,6CCANA    Culture NO GROWTH 1 DAY    Report Status PENDING   CULTURE, BLOOD (ROUTINE X 2) w Reflex to ID Panel     Status: None (Preliminary result)   Collection Time: 04/19/16 12:34 AM  Result Value Ref Range   Specimen Description BLOOD ARM LEFT LOWER    Special Requests BOTTLES DRAWN AEROBIC AND ANAEROBIC 2CCAERO,1CCANA    Culture NO GROWTH 1 DAY    Report Status PENDING   Glucose, capillary     Status: None   Collection Time: 04/19/16  7:51 AM  Result Value Ref Range   Glucose-Capillary 97 65 - 99 mg/dL   Comment 1 Notify RN    Comment 2 Document in Chart   Glucose, capillary     Status: Abnormal   Collection Time: 04/19/16 11:33 AM  Result Value Ref Range   Glucose-Capillary 107 (H) 65 - 99  mg/dL   Comment 1 Notify RN    Comment 2 Document in Chart   Glucose, capillary     Status: Abnormal   Collection Time:  04/19/16  4:09 PM  Result Value Ref Range   Glucose-Capillary 109 (H) 65 - 99 mg/dL   Comment 1 Notify RN    Comment 2 Document in Chart   Glucose, capillary     Status: Abnormal   Collection Time: 04/19/16  9:03 PM  Result Value Ref Range   Glucose-Capillary 124 (H) 65 - 99 mg/dL   No components found for: ESR, C REACTIVE PROTEIN MICRO: Recent Results (from the past 720 hour(s))  Blood Culture (routine x 2)     Status: Abnormal   Collection Time: 04/17/16  2:49 PM  Result Value Ref Range Status   Specimen Description BLOOD RIGHT WRIST  Final   Special Requests BOTTLES DRAWN AEROBIC AND ANAEROBIC ANA9ML AER9ML  Final   Culture  Setup Time   Final    GRAM POSITIVE COCCI IN BOTH AEROBIC AND ANAEROBIC BOTTLES CRITICAL RESULT CALLED TO, READ BACK BY AND VERIFIED WITH: NATE COOKSON AT 2706 04/18/16.PMH CONFIRMED BY [facility] Performed at [facility], [place] [address], [place], [place] [address]    Culture STAPHYLOCOCCUS AUREUS (A)  Final   Report Status 04/20/2016 FINAL  Final   Organism ID, Bacteria STAPHYLOCOCCUS AUREUS  Final      Susceptibility   Staphylococcus aureus - MIC*    CIPROFLOXACIN <=0.5 SENSITIVE Sensitive     ERYTHROMYCIN <=0.25 SENSITIVE Sensitive     GENTAMICIN <=0.5 SENSITIVE Sensitive     OXACILLIN <=0.25 SENSITIVE Sensitive     TETRACYCLINE <=1 SENSITIVE Sensitive     VANCOMYCIN <=0.5 SENSITIVE Sensitive     TRIMETH/SULFA <=10 SENSITIVE Sensitive     CLINDAMYCIN <=0.25 SENSITIVE Sensitive     RIFAMPIN <=0.5 SENSITIVE Sensitive     Inducible Clindamycin NEGATIVE Sensitive     * STAPHYLOCOCCUS AUREUS  Blood Culture ID Panel (Reflexed)     Status: Abnormal   Collection Time: 04/17/16  2:49 PM  Result Value Ref Range Status   Enterococcus species NOT DETECTED NOT DETECTED Final   Listeria monocytogenes NOT DETECTED NOT DETECTED Final    Staphylococcus species DETECTED (A) NOT DETECTED Final    Comment: CRITICAL RESULT CALLED TO, READ BACK BY AND VERIFIED WITH: NATE COOKSON AT 8315 04/18/16.PMH    Staphylococcus aureus DETECTED (A) NOT DETECTED Final    Comment: Methicillin (oxacillin) susceptible Staphylococcus aureus (MSSA). Preferred therapy is anti staphylococcal beta lactam antibiotic (Cefazolin or Nafcillin), unless clinically contraindicated. CRITICAL RESULT CALLED TO, READ BACK BY AND VERIFIED WITH: NATE COOKSON AT 1761 04/18/16.PMH    Methicillin resistance NOT DETECTED NOT DETECTED Final   Streptococcus species NOT DETECTED NOT DETECTED Final   Streptococcus agalactiae NOT DETECTED NOT DETECTED Final   Streptococcus pneumoniae NOT DETECTED NOT DETECTED Final   Streptococcus pyogenes NOT DETECTED NOT DETECTED Final   Acinetobacter baumannii NOT DETECTED NOT DETECTED Final   Enterobacteriaceae species NOT DETECTED NOT DETECTED Final   Enterobacter cloacae complex NOT DETECTED NOT DETECTED Final   Escherichia coli NOT DETECTED NOT DETECTED Final   Klebsiella oxytoca NOT DETECTED NOT DETECTED Final   Klebsiella pneumoniae NOT DETECTED NOT DETECTED Final   Proteus species NOT DETECTED NOT DETECTED Final   Serratia marcescens NOT DETECTED NOT DETECTED Final   Haemophilus influenzae NOT DETECTED NOT DETECTED Final   Neisseria meningitidis NOT DETECTED NOT DETECTED Final   Pseudomonas aeruginosa NOT DETECTED NOT DETECTED Final   Candida albicans NOT DETECTED NOT DETECTED Final   Candida glabrata NOT DETECTED NOT DETECTED Final   Candida krusei NOT DETECTED NOT DETECTED  Final   Candida parapsilosis NOT DETECTED NOT DETECTED Final   Candida tropicalis NOT DETECTED NOT DETECTED Final  Blood Culture (routine x 2)     Status: Abnormal   Collection Time: 04/17/16  3:01 PM  Result Value Ref Range Status   Specimen Description BLOOD LEFT ASSIST CONTROL  Final   Special Requests   Final    BOTTLES DRAWN AEROBIC AND ANAEROBIC  AER10ML ANA12ML   Culture  Setup Time   Final    GRAM POSITIVE COCCI IN BOTH AEROBIC AND ANAEROBIC BOTTLES CRITICAL RESULT CALLED TO, READ BACK BY AND VERIFIED WITH: NATE COOKSON AT 2202 04/18/16.PMH CONFIRMED BY [facility]    Culture (A)  Final    STAPHYLOCOCCUS AUREUS SUSCEPTIBILITIES PERFORMED ON PREVIOUS CULTURE WITHIN THE LAST 5 DAYS. Performed at [facility], [place] [address], [place], [place] [address]    Report Status 04/20/2016 FINAL  Final  Aerobic Culture (superficial specimen)     Status: None   Collection Time: 04/17/16  3:49 PM  Result Value Ref Range Status   Specimen Description LEG LEFT LOWER  Final   Special Requests NONE  Final   Gram Stain   Final    FEW WBC PRESENT, PREDOMINANTLY PMN RARE GRAM POSITIVE COCCI IN CLUSTERS Performed at [facility], [place] [address], [place], [place] [address]    Culture MODERATE STAPHYLOCOCCUS AUREUS  Final   Report Status 04/20/2016 FINAL  Final   Organism ID, Bacteria STAPHYLOCOCCUS AUREUS  Final      Susceptibility   Staphylococcus aureus - MIC*    CIPROFLOXACIN <=0.5 SENSITIVE Sensitive     ERYTHROMYCIN <=0.25 SENSITIVE Sensitive     GENTAMICIN <=0.5 SENSITIVE Sensitive     OXACILLIN <=0.25 SENSITIVE Sensitive     TETRACYCLINE <=1 SENSITIVE Sensitive     VANCOMYCIN <=0.5 SENSITIVE Sensitive     TRIMETH/SULFA <=10 SENSITIVE Sensitive     CLINDAMYCIN <=0.25 SENSITIVE Sensitive     RIFAMPIN <=0.5 SENSITIVE Sensitive     Inducible Clindamycin NEGATIVE Sensitive     * MODERATE STAPHYLOCOCCUS AUREUS  MRSA PCR Screening     Status: None   Collection Time: 04/18/16  6:30 AM  Result Value Ref Range Status   MRSA by PCR NEGATIVE NEGATIVE Final    Comment:        The GeneXpert MRSA Assay (FDA approved for NASAL specimens only), is one component of a comprehensive MRSA colonization surveillance program. It is not intended to diagnose MRSA infection nor to guide or monitor treatment for MRSA infections.   CULTURE,  BLOOD (ROUTINE X 2) w Reflex to ID Panel     Status: None (Preliminary result)   Collection Time: 04/19/16 12:34 AM  Result Value Ref Range Status   Specimen Description BLOOD ARM LEFT UPPER  Final   Special Requests BOTTLES DRAWN AEROBIC AND ANAEROBIC [place]  Final   Culture NO GROWTH 1 DAY  Final   Report Status PENDING  Incomplete  CULTURE, BLOOD (ROUTINE X 2) w Reflex to ID Panel     Status: None (Preliminary result)   Collection Time: 04/19/16 12:34 AM  Result Value Ref Range Status   Specimen Description BLOOD ARM LEFT LOWER  Final   Special Requests BOTTLES DRAWN AEROBIC AND ANAEROBIC 2CCAERO,1CCANA  Final   Culture NO GROWTH 1 DAY  Final   Report Status PENDING  Incomplete    IMAGING: Dg Ankle Complete Left  Result Date: 04/17/2016 CLINICAL DATA:  Lower extremity bleeding. EXAM: LEFT ANKLE COMPLETE -  3+ VIEW COMPARISON:  None. FINDINGS: The patient has an acute appearing transverse fracture of the fibula at the junction of the middle and distal thirds of the diaphysis. Healed diaphyseal fracture of the fibula with plate and screws in place is identified. There is also a healed medial malleolar fracture with fixation screws in place. Marked soft tissue swelling is present about the ankle diffusely, worse on the medial side. The subtalar joint appears autologously fused. The medial clear space and syndesmosis between the tibia and fibula appears abnormally widened. No soft tissue gas is identified. No unexpected radiopaque foreign body is seen. IMPRESSION: Nondisplaced transverse diaphyseal fracture left fibula. Marked soft tissue swelling about the left ankle could be due to dependent change or cellulitis. Widening of the medial clear space of the ankle and syndesmosis of the distal tibia and fibula is compatible with ankle instability, age indeterminate. Electronically Signed   By: Inge Rise M.D.   On: 04/17/2016 15:18    Assessment:   [name] is a 31 y.o. male  with spina bifida who is very pleasant and active and uses braces on his feet when he works at [employer] will. No admitted with marked infection in his L ankle, with a large draining abscess medially with wound and bcx growing MSSA. He is clinically improving but still has large amount of infection in the leg.  Complicated by the presence of hardware (2 screws) in the medial ankle.  Repeat bcx 2/4 NGTD. Echo pending.   Recommendations Cont ancef Add rifampin given the presence of hardware in the area of infection Discussed with his father who is a Animal nutritionist, that I believe he needs a washout of the area given the marked inflammation and infection. If the screws could be removed that would also be ideal.  This can be done here or at [facility] but if there is delay in transfer would prefer it to be done here.   Will need a min 2 weeks IV ancef - can place Picc when Bcx is neg x 48 hours.      Thank you very much for allowing me to participate in the care of this patient. Please call with questions.   Cheral Marker. Ola Spurr, MD

## 2016-04-20 NOTE — Progress Notes (Signed)
[facility] [place] [facility]   PATIENT NAME: [name]    MR#:  [id number]  DATE OF BIRTH:  12/17/1985  SUBJECTIVE:  CHIEF COMPLAINT:   Chief Complaint  Patient presents with  . Leg Swelling   Hx of spinabifida and VP shunt. Came with swelling on left ankle and wound. Bl cx positive for Staph. MSSA, repeat cx are negative.  No complains by pt. No fever in last 2 days.  REVIEW OF SYSTEMS:  CONSTITUTIONAL: No fever, fatigue or weakness.  EYES: No blurred or double vision.  EARS, NOSE, AND THROAT: No tinnitus or ear pain.  RESPIRATORY: No cough, shortness of breath, wheezing or hemoptysis.  CARDIOVASCULAR: No chest pain, orthopnea, edema.  GASTROINTESTINAL: No nausea, vomiting, diarrhea or abdominal pain.  GENITOURINARY: No dysuria, hematuria.  ENDOCRINE: No polyuria, nocturia,  HEMATOLOGY: No anemia, easy bruising or bleeding SKIN: No rash , have wound and some bleeding around left ankle. MUSCULOSKELETAL: No joint pain or arthritis.   NEUROLOGIC: No tingling, numbness, weakness.  PSYCHIATRY: No anxiety or depression.   ROS  DRUG ALLERGIES:   Allergies  Allergen Reactions  . Sulfa Antibiotics Other (See Comments)    Mind racing     VITALS:  Blood pressure 133/61, pulse 98, temperature 97.5 F (36.4 C), temperature source Oral, resp. rate 17, height 5\' 7"  (1.702 m), weight 77.1 kg (170 lb), SpO2 98 %.  PHYSICAL EXAMINATION:   GENERAL:  31 y.o.-year-old patient lying in the bed with no acute distress.  EYES: Pupils equal, round, reactive to light and accommodation. No scleral icterus. Extraocular muscles intact.  HEENT: Head atraumatic, normocephalic. Oropharynx and nasopharynx clear.  NECK:  Supple, no jugular venous distention. No thyroid enlargement, no tenderness.  LUNGS: Normal breath sounds bilaterally, no wheezing, rales,rhonchi or crepitation. No use of accessory muscles of respiration.  CARDIOVASCULAR: S1, S2 normal. No murmurs,  rubs, or gallops.  ABDOMEN: Soft, nontender, nondistended. Bowel sounds present. No organomegaly or mass.  EXTREMITIES: Patient has significant erythema, edema of  left leg. Also a laceration approximately 4 cm in length on the medial aspect of the left lower extremity,redish clear fluid is draining Have both feet atrophic changes. Redness on leg is contained to the skin markings done on admission. NEUROLOGIC: Cranial nerves II through XII are intact. Muscle strength 5/5 in all extremities. Sensation intact. Gait not checked.  PSYCHIATRIC: The patient is alert and oriented x 3.  SKIN: Erythema of the left leg,  Physical Exam LABORATORY PANEL:   CBC  Recent Labs Lab 04/19/16 0034  WBC 15.7*  HGB 12.2*  HCT 36.2*  PLT 384   ------------------------------------------------------------------------------------------------------------------  Chemistries   Recent Labs Lab 04/17/16 1449 04/18/16 0424 04/19/16 0034  NA 127* 132* 132*  K 3.3* 2.8* 3.1*  CL 89* 99* 98*  CO2 23 25 25   GLUCOSE 100* 140* 100*  BUN 23* 15 14  CREATININE 1.33* 0.72 0.68  CALCIUM 8.5* 7.4* 7.3*  MG  --  2.2  --   AST 97*  --   --   ALT 63  --   --   ALKPHOS 123  --   --   BILITOT 2.2*  --   --    ------------------------------------------------------------------------------------------------------------------  Cardiac Enzymes No results for input(s): TROPONINI in the last 168 hours. ------------------------------------------------------------------------------------------------------------------  RADIOLOGY:  No results found.  ASSESSMENT AND PLAN:   Active Problems:   Left leg cellulitis  * sepsis secondary to left leg cellulitis:    Bacteremia with  MSSA    on  iv antibiotics with vancomycin, Zosyn. No evidence of abscess at this time. Has evidence of tachycardia, leukocytosis, renal failure.   Both blood cx- positive for staph.   MSSA, changed vanc to cefepime.   Echocardiogram   ID  consult, pt have VP shunt.   Repeat cx sent- negative for 1 day.   #2 hyponatremia, hypokalemia secondary to sepsis and dehydration: Continue aggressive hydration, potassium supplementation.   Checked mg.  #3. Distal fibular fracture on the left side; consult orthopedic. Has history of spina bifida D/w patient and his mother.  No need for surgery as per ortho.  As per podiatry- need I & D.  Pt and family preferred to be transferred to [facility].  he is accepted at [facility], but awaited bed availability.   They will like to continue other treatment except procedures or surgery here, while waiting for transfer.   All the records are reviewed and case discussed with Care Management/Social Workerr. Management plans discussed with the patient, family and they are in agreement.  CODE STATUS: Full.  TOTAL TIME TAKING CARE OF THIS PATIENT: 35 minutes.   POSSIBLE D/C IN 1-2 DAYS, DEPENDING ON CLINICAL CONDITION.   Altamese Dilling M.D on 04/20/2016   Between 7am to 6pm - Pager - [phone]  After 6pm go to [email or web address] - Social research officer, government  [facility]  Office  [phone]  CC: Primary care physician; No primary care provider on file.  Note: This dictation was prepared with Dragon dictation along with smaller phrase technology. Any transcriptional errors that result from this process are unintentional.

## 2016-04-20 NOTE — Progress Notes (Signed)
[facility] contacted by clerical staff who advised that a medical bed is not currently available, assured that pt is on list for this. ID consult completed, pt remains awaiting echocardiogram despite this writer making several calls in the attempt to get this completed. Father at bedside, would to l ankle area continues to drain serosanguinous fluid, pt with no discomfort.

## 2016-04-20 NOTE — Progress Notes (Signed)
Shift assessment completed. Pt's mother at bedside, discussed with pt and mother that transfer to [facility] is pending, waiting for bed to be assigned. Per Dr.'s dictation yesterday, there is an accepting physician. Pt's L foot has dressing intact and saturated, has drained serosanguinous fluid through dressing and onto linens, R toe dressing in place, has drained some serosanguinous fluid as well. This writer changed dressing to L ankle, area appears deformed, edematous, open area with red wound bed and slough coming from wound that appears viscous. Clean abd pads applied and wrapped with kling. Redness to l le has faded within inked boundaries above pt's ankle. Pt stated he has no pain to wounds, has significantly decreased sensation due to spina bifida. Pt assisted with adls by staff. Call bell in reach.

## 2016-04-20 NOTE — Progress Notes (Signed)
Pt requested if he can get his scheduled antibiotic tonight prior to transfer. Cefazolin 2g given IV. CBG taken=89, no sliding scale insulin given. [facility] Alys[facility].

## 2016-04-20 NOTE — Progress Notes (Signed)
Dr. Desiree HaneVachani has been in on rounds. This Clinical research associatewriter reported that pt's echo not done yet. This Clinical research associatewriter called [facility] and left message on voicemail, also called ultrasound for further direction to reach [facility].

## 2016-04-20 NOTE — Progress Notes (Signed)
Pt came back from ECHO, alert and oriented X4. [facility]. Will prepare and print transfer packet. Pt and family aware of transfer tonight. [facility] called and made aware of the transfer.

## 2016-04-20 NOTE — Progress Notes (Signed)
Pt off the unit at this time for echo, unit just received notification that [facility] has a bed for pt.

## 2016-04-20 NOTE — Progress Notes (Signed)
[facility] called and spoke to [place], report given. Peripheral IV on the right forearm leaking, removed, dressing applied.

## 2016-04-20 NOTE — Progress Notes (Signed)
Pt still in house. [facility]. Would like to consider I & D.  I will place on Wednesday pm schedule to perform if still in house. Will f/u tomorrow.

## 2016-04-21 ENCOUNTER — Ambulatory Visit (HOSPITAL_COMMUNITY)
Admission: AD | Admit: 2016-04-21 | Discharge: 2016-04-21 | Disposition: A | Discharge status: Home / Self Care | Payer: BC Managed Care – PPO | Source: Other Acute Inpatient Hospital | Attending: Internal Medicine | Admitting: Internal Medicine

## 2016-04-21 DIAGNOSIS — L03116 Cellulitis of left lower limb: Secondary | ICD-10-CM | POA: Insufficient documentation

## 2016-04-21 LAB — GLUCOSE, CAPILLARY: Glucose-Capillary: 107 mg/dL — ABNORMAL HIGH (ref 65–99)

## 2016-04-21 LAB — ECHOCARDIOGRAM COMPLETE
Height: 67 in
Weight: 2720 oz

## 2016-04-21 NOTE — Progress Notes (Signed)
Pt left the unit per stretcher, alert and orientedX4, not in any pain or distress and transported to [facility] by [facility].

## 2016-04-24 LAB — CULTURE, BLOOD (ROUTINE X 2)
CULTURE: NO GROWTH
CULTURE: NO GROWTH

## 2016-05-18 ENCOUNTER — Other Ambulatory Visit
Admission: RE | Admit: 2016-05-18 | Discharge: 2016-05-18 | Disposition: A | Discharge status: Home / Self Care | Payer: BC Managed Care – PPO | Source: Other Acute Inpatient Hospital | Attending: Adult Health Nurse Practitioner | Admitting: Adult Health Nurse Practitioner

## 2016-05-18 DIAGNOSIS — L03116 Cellulitis of left lower limb: Secondary | ICD-10-CM | POA: Insufficient documentation

## 2016-05-18 LAB — CBC WITH DIFFERENTIAL/PLATELET
Basophils Absolute: 0.1 10*3/uL (ref 0–0.1)
Basophils Relative: 1 %
Eosinophils Absolute: 0.1 10*3/uL (ref 0–0.7)
Eosinophils Relative: 1 %
HEMATOCRIT: 40.4 % (ref 40.0–52.0)
HEMOGLOBIN: 13.7 g/dL (ref 13.0–18.0)
LYMPHS PCT: 23 %
Lymphs Abs: 2.2 10*3/uL (ref 1.0–3.6)
MCH: 29.2 pg (ref 26.0–34.0)
MCHC: 34 g/dL (ref 32.0–36.0)
MCV: 85.9 fL (ref 80.0–100.0)
MONO ABS: 1 10*3/uL (ref 0.2–1.0)
Monocytes Relative: 11 %
NEUTROS ABS: 6.2 10*3/uL (ref 1.4–6.5)
NEUTROS PCT: 64 %
Platelets: 410 10*3/uL (ref 150–440)
RBC: 4.7 MIL/uL (ref 4.40–5.90)
RDW: 14.8 % — AB (ref 11.5–14.5)
WBC: 9.5 10*3/uL (ref 3.8–10.6)

## 2016-05-18 LAB — COMPREHENSIVE METABOLIC PANEL
ALBUMIN: 3.8 g/dL (ref 3.5–5.0)
ALT: 8 U/L — ABNORMAL LOW (ref 17–63)
ANION GAP: 6 (ref 5–15)
AST: 22 U/L (ref 15–41)
Alkaline Phosphatase: 101 U/L (ref 38–126)
BILIRUBIN TOTAL: 0.5 mg/dL (ref 0.3–1.2)
BUN: 15 mg/dL (ref 6–20)
CO2: 28 mmol/L (ref 22–32)
Calcium: 9.3 mg/dL (ref 8.9–10.3)
Chloride: 101 mmol/L (ref 101–111)
Creatinine, Ser: 0.62 mg/dL (ref 0.61–1.24)
GFR calc non Af Amer: >60 mL/min (ref >60)
GLUCOSE: 80 mg/dL (ref 65–99)
POTASSIUM: 4.6 mmol/L (ref 3.5–5.1)
SODIUM: 135 mmol/L (ref 135–145)
Total Protein: 7.2 g/dL (ref 6.5–8.1)

## 2016-05-18 LAB — C-REACTIVE PROTEIN: CRP: 0.8 mg/dL (ref <1.0)

## 2016-05-18 LAB — SEDIMENTATION RATE: SED RATE: 11 mm/h (ref 0–15)

## 2016-05-20 ENCOUNTER — Encounter: Payer: Self-pay | Admitting: Primary Care

## 2016-05-20 ENCOUNTER — Ambulatory Visit (INDEPENDENT_AMBULATORY_CARE_PROVIDER_SITE_OTHER): Payer: BC Managed Care – PPO | Admitting: Primary Care

## 2016-05-20 VITALS — BP 140/80 | HR 102 | Temp 97.4°F

## 2016-05-20 DIAGNOSIS — Z89512 Acquired absence of left leg below knee: Secondary | ICD-10-CM

## 2016-05-20 DIAGNOSIS — R030 Elevated blood-pressure reading, without diagnosis of hypertension: Secondary | ICD-10-CM

## 2016-05-20 DIAGNOSIS — Z7689 Persons encountering health services in other specified circumstances: Secondary | ICD-10-CM

## 2016-05-20 DIAGNOSIS — Q059 Spina bifida, unspecified: Secondary | ICD-10-CM

## 2016-05-20 DIAGNOSIS — Z09 Encounter for follow-up examination after completed treatment for conditions other than malignant neoplasm: Secondary | ICD-10-CM

## 2016-05-20 NOTE — Progress Notes (Signed)
Pre visit review using our clinic review tool, if applicable. No additional management support is needed unless otherwise documented below in the visit note. 

## 2016-05-20 NOTE — Progress Notes (Signed)
Subjective:    Patient ID: [name], male    DOB: [birth year], 31 y.o.   MRN: [id number]  HPI  [name] is a 31 year old male who presents today to establish care and discuss the problems mentioned below. Will review all records.  1) Hospital Follow Up:   Presented to [facility] ED on 04/17/16 with a chief complaint of left lower extremity swelling and fever. Wears braces to bilateral lower extremities as a result of spina bifida. Symptoms present for 4 days, also with laceration to the left lateral foot.   During his stay in the ED he underwent xray which showed non-displaced transverse diaphyseal fracture of the left fibula and evidence of cellulitis. Labs with leukocytosis, hyponatremia, hypokalemia, reduced renal function. He was treated with IV antibiotics, IV fluids and admitted for sepsis and further evaluation.  During his hospital stay at [facility] he was aggressively rehydrated and treated with IV antibiotics. Blood cultures came back positive for staph and antibiotics were changed accordingly. He was noted to have hyperglycemia without prior history of diabetes which was closely monitored; A1C of 5.3. He was consulted by orthopedic surgery who recommended PT, IV antibiotics, and continued wound care, did not recommend surgery on 02/03. Podiatry consulted and recommended I&D of ankle for treatment of ankle abscess and right 3rd toe ulceration. He and his family wanted to have this surgery done at [facility], so he was placed on the wait list for transfer. Echocardiogram without evidence of bacterial vegetation.   While waiting for transfer to [facility] he underwent infectious disease consult who recommended surgery to be completed soon, Podiatry agreed. He was [facility] on [facility] and did not complete surgery at [facility].  During [facility] he underwent several surgeries for removal of hardware to left ankle, I&D, removal of hardware of tibia, left BKA, PICC line placement and several  other procedures. He developed tachycardia during [facility] stay [facility], negative for DVT, positive multiple sub-segmental PE's. He was initiated on apixaban course and was recommended to continue for 3 months. He was discharged home on 04/30/16.  Since discharge home he's doing well. He's currently managed by Infectious Disease and Orthopedics with [facility]. He will be participating in outpatient physical therapy. He denies fevers, bleeding, tachycardia, weakness. He is compliant to his eliquis and is taking 5 mg BID. They are uncertain on how long to take this medication as they were not told.  He's followed up with orthopedics on 03/05 with removal of every other suture, will have remaining sutures removed 10 days later. The incision looked good based off of chart review. He underwent repeat labs on 05/19/16 which were received by ID through [facility]. He has upcoming appointments with: ID on 05/25/16, Ortho for suture removal on 05/29/16, and Ortho for follow up on 06/03/16.  2) Elevated Blood Pressure Reading: BP in the office of 152/80. He denies chest pain, shortness of breath, prior history of hypertension. He does admit to "white coat syndrome". BP recheck in the office was 140/80.   3) Urinary Incontinence: Diagnosed since childhood. Uses depends. He denies urinary frequency, dysuria, hematuria, abdominal pain.  4) Spina Bifida: Diagnosed at birth. 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 He lives alone and is independent. Prior to his recent surgery he was ambulatory with braces. He hopes to have a prosthesis to his left lower extremity.   Review of Systems  Constitutional: Negative for fever.  Respiratory: Negative for shortness of breath.   Cardiovascular: Negative for chest pain.  Gastrointestinal: Negative for nausea.  Genitourinary: Negative for dysuria.       Incontinence since childhood  Musculoskeletal: Negative for  myalgias.  Skin: Negative for color change.  Neurological: Negative for dizziness and weakness.  Hematological: Negative for adenopathy.  Psychiatric/Behavioral:       Encouraged to start walking again. No depression.       Past Medical History:  Diagnosis Date  . History of UTI   . Hydrocephalus   . Pulmonary embolism (HCC)   . Spina bifida (HCC)   . Urine incontinence      Social History   Social History  . Marital status: Single    Spouse name: N/A  . Number of children: N/A  . Years of education: N/A   Occupational History  . Not on file.   Social History Main Topics  . Smoking status: Never Smoker  . Smokeless tobacco: Never Used  . Alcohol use Yes  . Drug use: No  . Sexual activity: Not on file   Other Topics Concern  . Not on file   Social History Narrative   Single.    No children.   Works as a Occupational hygienist.    Enjoys video games, reading, photography.    Past Surgical History:  Procedure Laterality Date  . LEG SURGERY     X 5  . VENTRICULOPERITONEAL SHUNT      Family History  Problem Relation Age of Onset  . Hypertension Father   . Lung cancer Maternal Aunt   . Breast cancer Maternal Grandmother   . Mental illness Maternal Grandmother     Allergies  Allergen Reactions  . Latex Other (See Comments)  . Sulfa Antibiotics Other (See Comments) and Palpitations    Mind racing     No current outpatient prescriptions on file prior to visit.   No current facility-administered medications on file prior to visit.     BP 140/80   Pulse (!) 102   Temp 97.4 F (36.3 C) (Oral)   SpO2 99%    Objective:   Physical Exam  Constitutional: He is oriented to person, place, and time. He appears well-nourished.  Neck: Neck supple.  Cardiovascular: Normal rate and regular rhythm.   Pulmonary/Chest: Effort normal and breath sounds normal. He has no wheezes. He has no rales.  Musculoskeletal:  Good movement to left lower extremity. Dressings and  wrap in place.  Neurological: He is alert and oriented to person, place, and time.  Skin: Skin is warm and dry.  Psychiatric: He has a normal mood and affect.          Assessment & Plan:  Lower Extremity Cellulitis/Sepsis/Left BKA:  Admitted to [facility] for left lower extremity cellulitis. Transferred to [facility] for I&D. 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 Overall doing well, motivated to start walking again. Plans on prosthesis. Following with ID and ortho as directed. Compliant to Eliquis. Based of of chart review, he should be on this for 3 months. Will have him continue at least until then, may continue longer if necessary. Discussed potential complications of blood thinners. Complete home PT. Continue scheduled follow up.  All hospital records from [facility] and [facility] reviewed. Morrie Sheldon, NP

## 2016-05-20 NOTE — Patient Instructions (Addendum)
Continue Eliquis 5 mg twice daily for now.   I will be in touch with you in regards to follow up for the pulmonary embolism once I've reviewed your chart.  Follow up annually for a physical or sooner if needed.  It was a pleasure to meet you today! Please don't hesitate to call me with any questions. Welcome to [facility]!

## 2016-05-22 ENCOUNTER — Other Ambulatory Visit: Payer: Self-pay | Admitting: Primary Care

## 2016-05-22 DIAGNOSIS — I2699 Other pulmonary embolism without acute cor pulmonale: Secondary | ICD-10-CM

## 2016-05-22 MED ORDER — APIXABAN 5 MG PO TABS: 5.0000 mg | ORAL_TABLET | Freq: Two times a day (BID) | ORAL | 0 refills | Status: DC

## 2016-06-01 ENCOUNTER — Telehealth: Payer: Self-pay | Admitting: *Deleted

## 2016-06-01 DIAGNOSIS — I2699 Other pulmonary embolism without acute cor pulmonale: Secondary | ICD-10-CM

## 2016-06-01 MED ORDER — APIXABAN 5 MG PO TABS: 5.0000 mg | ORAL_TABLET | Freq: Two times a day (BID) | ORAL | 0 refills | Status: DC

## 2016-06-01 NOTE — Telephone Encounter (Signed)
Spoken and notified patient's mother of Kate's comments.   Patient's mother stated that she did not pick up the refill of Eliquis (90 days) that was send on 05/22/2016.  Mother's [facility]

## 2016-06-01 NOTE — Telephone Encounter (Signed)
Three months is fine if that's what [facility] suggested. Does she just need two additional months of medication?

## 2016-06-01 NOTE — Telephone Encounter (Signed)
PT's mom came by and dropped off his discharge papers from [facility] for Herminie to have. Also she wanted to bring to Kate's attention that the Doctor from [facility] suggested he have the treatment for only 3 months. If Jae Dire agrees she would like for the prescription to be changed. She has not yet picked it up from the pharmacy. Please call the mom. Discharge papers given to [facility].

## 2016-06-01 NOTE — Telephone Encounter (Signed)
Noted and Rx sent for 60 days. [facility] ensure [facility] is aware of the new Rx of 60 days.

## 2016-06-01 NOTE — Telephone Encounter (Signed)
[facility]ified [facility] and also I have cancel the 90 days supply.

## 2016-08-04 ENCOUNTER — Telehealth: Payer: Self-pay | Admitting: *Deleted

## 2016-08-04 ENCOUNTER — Other Ambulatory Visit: Payer: Self-pay | Admitting: Primary Care

## 2016-08-04 DIAGNOSIS — I2699 Other pulmonary embolism without acute cor pulmonale: Secondary | ICD-10-CM

## 2016-08-04 NOTE — Telephone Encounter (Signed)
Ok to refill? Electronically refill request for apixaban (ELIQUIS) 5 MG TABS tablet. Last prescribed on 06/01/2016. Last seen on 05/20/2016

## 2016-08-04 NOTE — Telephone Encounter (Signed)
Do they actually need any additional tablets or was this just an automatic request?

## 2016-08-04 NOTE — Telephone Encounter (Signed)
Rose and/or Dawn, can you take a look?

## 2016-08-04 NOTE — Telephone Encounter (Signed)
Mother says that Molli HazardMatthew saw Jae DireKate at the insistence from [facility] to establish with a PCP.  The visit went well but the patient's insurance did not pay the visit and she doesn't know why.  Mom says she tried calling the [facility] billing department but didn't get anywhere.  Mom feels that maybe the visit was coded incorrectly because he has really good insurance and they usually pay well.  Mom asks if she could get someone to call her about this.

## 2016-08-04 NOTE — Telephone Encounter (Signed)
No, they don't need this refill.  Mother doesn't know why it was requested.

## 2016-08-05 NOTE — Telephone Encounter (Signed)
Hi Kate,  I am having the dx corrected to spina bifida, below knee amputation and elevated BP without dx of hypertension.  Originally this was filed with Z76.89 (Persons encountering health services in other specified circumstances), this code is why it was denied, usually insurances will not pay with this dx.  Let me know if any questions.   Thanks,  [facility]

## 2016-08-05 NOTE — Telephone Encounter (Signed)
Thanks, [facility]. Johny DrillingChan, will you notify patient's mother that this was taken care of?

## 2016-08-06 NOTE — Telephone Encounter (Signed)
Tried to call patient at home number which will also mother's number. Could not leave message due to voicemail box is not set up.

## 2016-08-06 NOTE — Telephone Encounter (Signed)
He may just discontinue, no need to taper down.

## 2016-08-06 NOTE — Telephone Encounter (Signed)
Spoken to patient's mother and notified her of the comment below.  Also patient's mother would like to know if patient need to taper down the Eliquis or can he just stop.

## 2016-08-07 NOTE — Telephone Encounter (Signed)
Spoken and notified patient's mother of Kate's comments. Patient's mother verbalized understanding. 

## 2016-08-07 NOTE — Telephone Encounter (Signed)
Tried to call patient at home number which will also mother's number. Could not leave message due to voicemail box is not set up. 

## 2018-03-21 ENCOUNTER — Ambulatory Visit: Payer: BC Managed Care – PPO | Admitting: Family Medicine

## 2018-03-21 DIAGNOSIS — Z0289 Encounter for other administrative examinations: Secondary | ICD-10-CM

## 2018-04-24 IMAGING — DX DG ANKLE COMPLETE 3+V*L*
3 series · 3 of 3 positions shown · non-contrast
Comparison: None.

CLINICAL DATA: Lower extremity bleeding.

EXAM:
LEFT ANKLE COMPLETE - 3+ VIEW

[ankle ap]
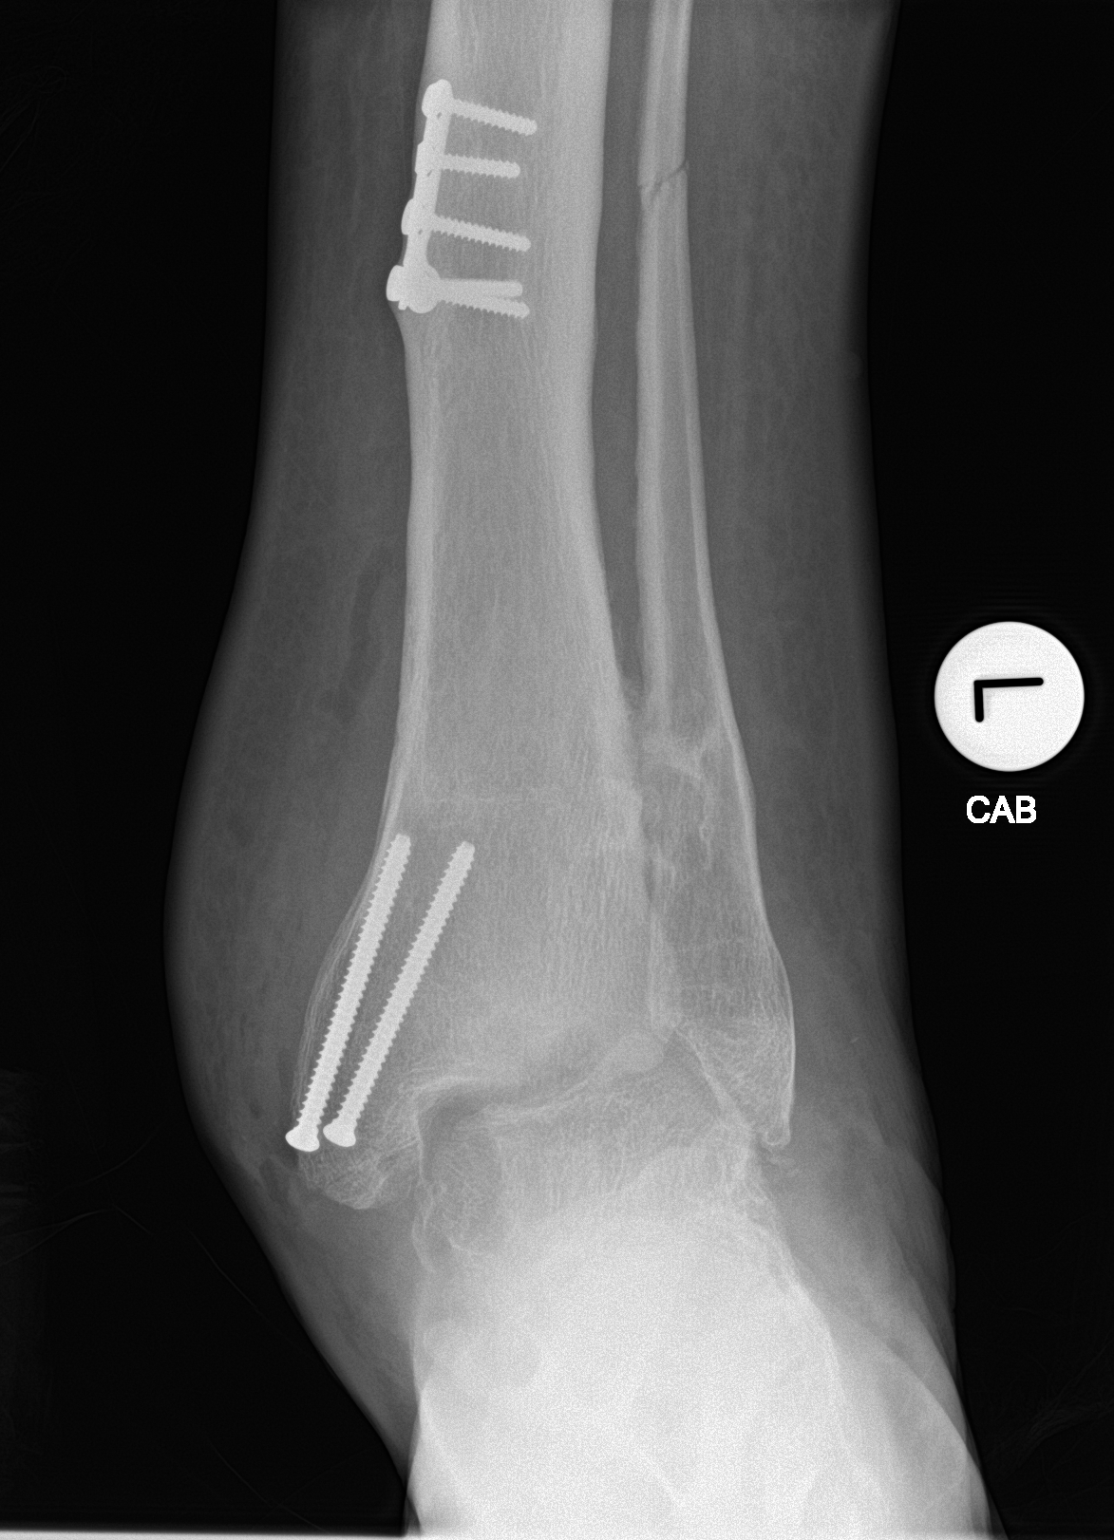

[ankle obl]
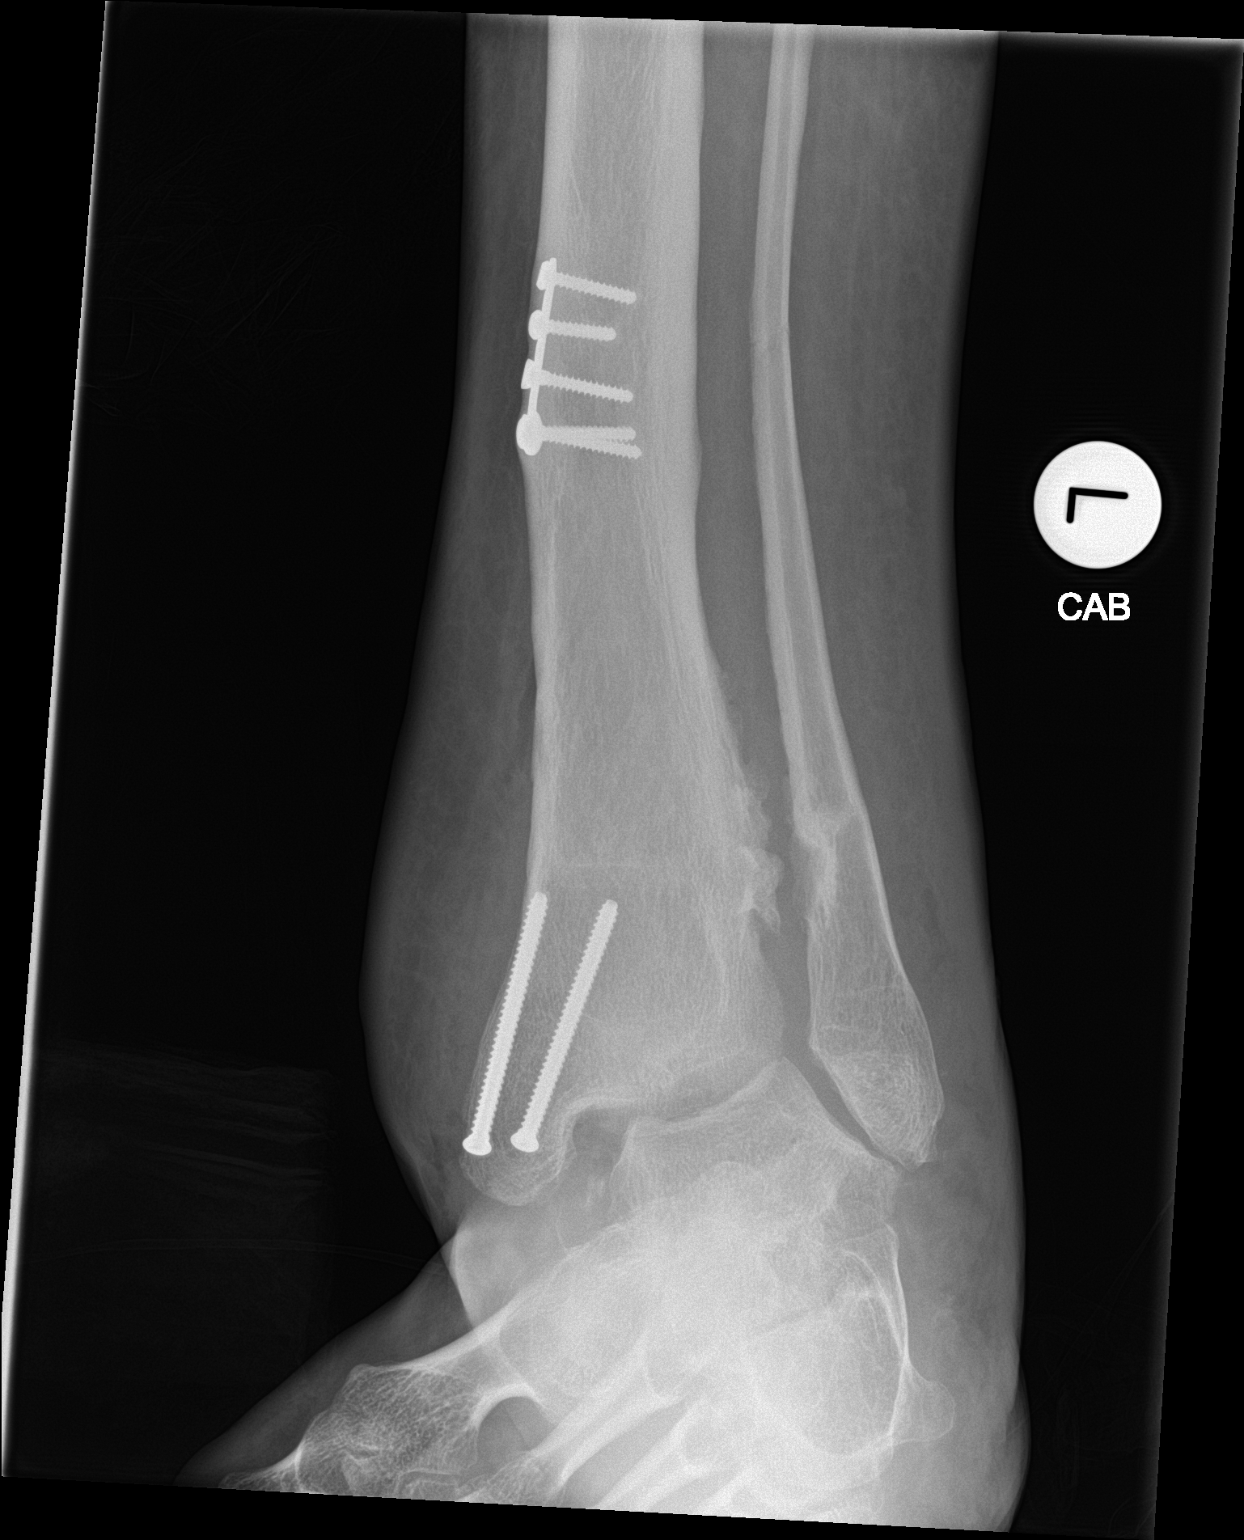

[ankle lat]
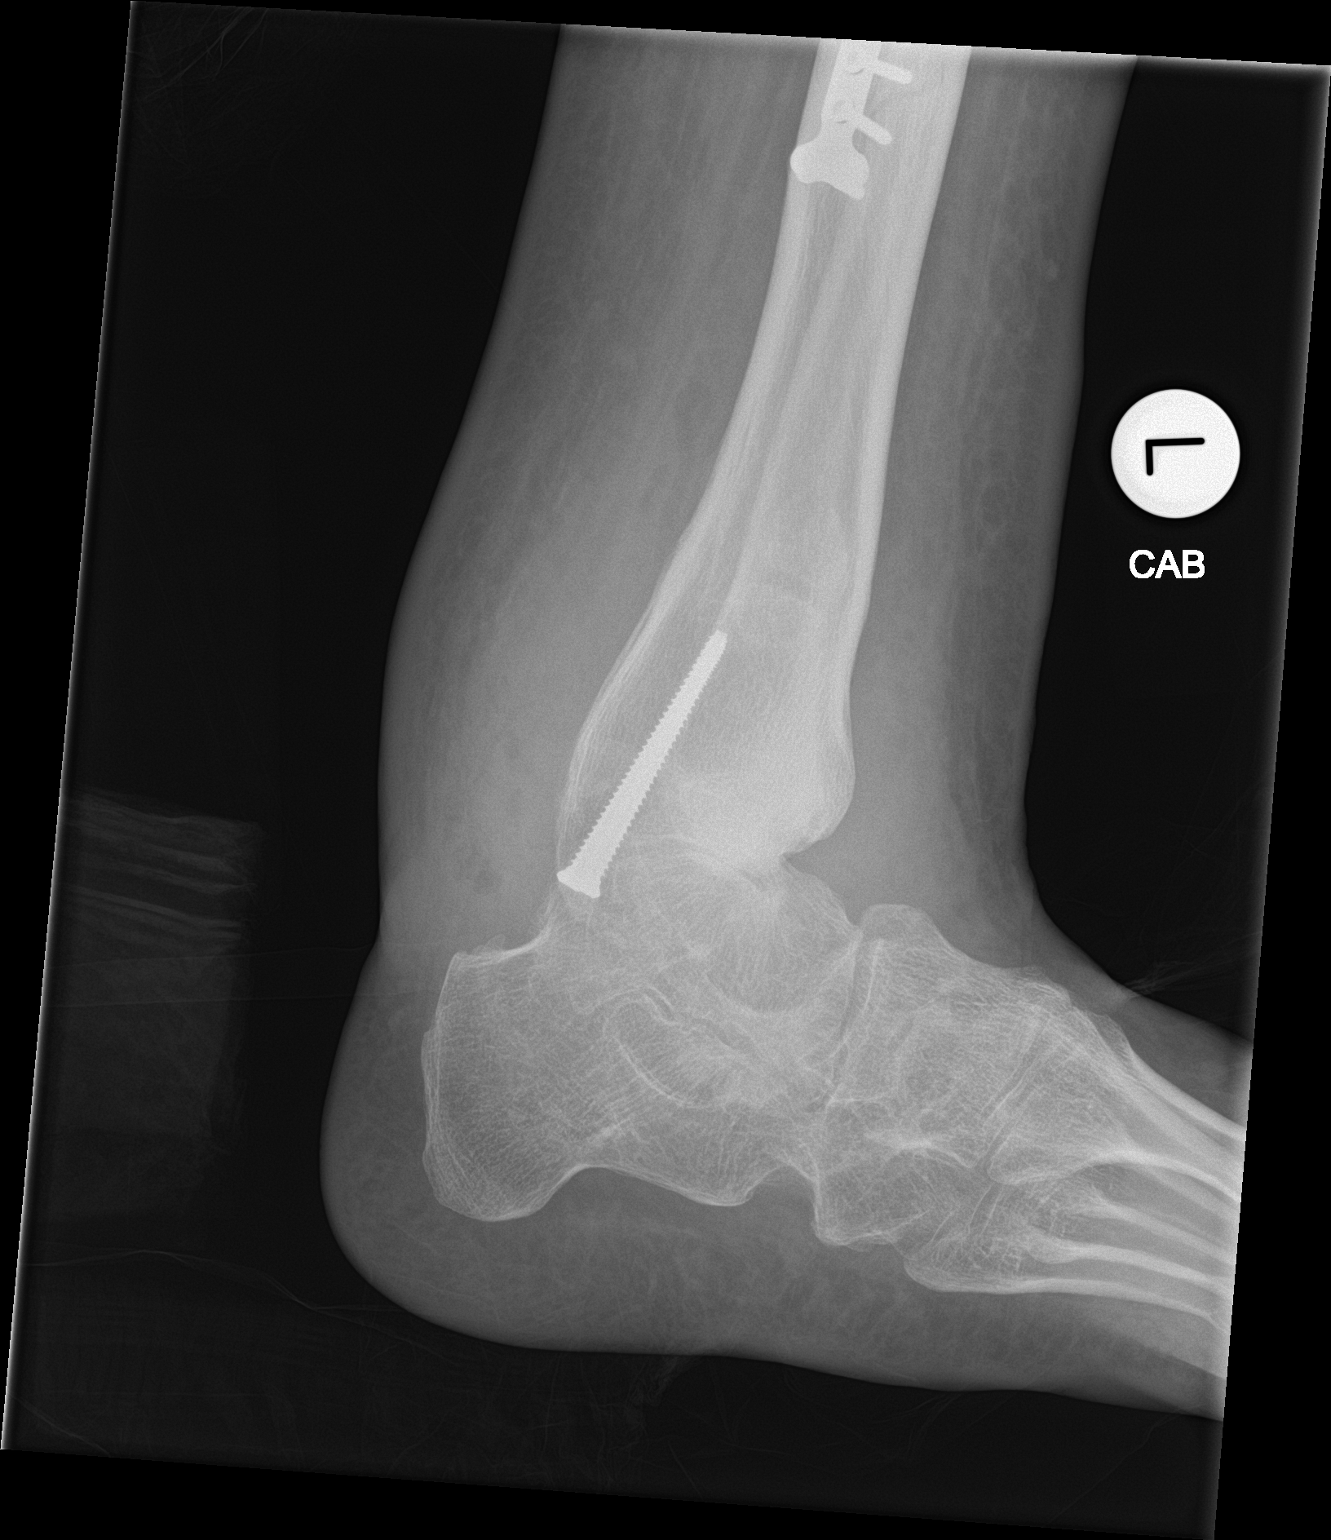

[3 of 3 positions shown; findings below may reference images not displayed]

FINDINGS: The patient has an acute appearing transverse fracture of the fibula
at the junction of the middle and distal thirds of the diaphysis.
Healed diaphyseal fracture of the fibula with plate and screws in
place is identified. There is also a healed medial malleolar
fracture with fixation screws in place. Marked soft tissue swelling
is present about the ankle diffusely, worse on the medial side. The
subtalar joint appears autologously fused. The medial clear space
and syndesmosis between the tibia and fibula appears abnormally
widened. No soft tissue gas is identified. No unexpected radiopaque
foreign body is seen.
IMPRESSION: Nondisplaced transverse diaphyseal fracture left fibula.

Marked soft tissue swelling about the left ankle could be due to
dependent change or cellulitis.

Widening of the medial clear space of the ankle and syndesmosis of
the distal tibia and fibula is compatible with ankle instability,
age indeterminate.

## 2019-05-31 ENCOUNTER — Telehealth: Payer: Self-pay | Admitting: Adult Health

## 2019-05-31 ENCOUNTER — Ambulatory Visit (INDEPENDENT_AMBULATORY_CARE_PROVIDER_SITE_OTHER): Payer: BC Managed Care – PPO | Admitting: Primary Care

## 2019-05-31 ENCOUNTER — Encounter: Payer: Self-pay | Admitting: Primary Care

## 2019-05-31 DIAGNOSIS — U071 COVID-19: Secondary | ICD-10-CM

## 2019-05-31 NOTE — Progress Notes (Signed)
Subjective:    Patient ID: [name], male    DOB: Dec 29, 1985, 34 y.o.   MRN: [id number]  HPI  Virtual Visit via Video Note  I connected with [name] on 05/31/19 at  9:40 AM EDT by a video enabled telemedicine application and verified that I am speaking with the correct person using two identifiers.  Location: Patient: Home Provider: Office   I discussed the limitations of evaluation and management by telemedicine and the availability of in person appointments. The patient expressed understanding and agreed to proceed.  History of Present Illness:  Mr. Joines is a 34 year old male with a history of spina bifida with hydrocephalus, pulmonary embolism, Covid-19 infection who presents today to discuss Covid-19 infection and to re-establish care. He has not been seen by our clinic since March 7th, 2018.  Diagnosed with Covid-19 today with positive result. Symptoms began about five days ago (March 12th) with nasal congestion which he initially thought was allergy related. He lost his sense of smell the following day, has noticed some return in smell now. He had his first Covid-19 vaccine the same day his symptoms began (March 12th) just after he noticed symptoms of his nasal congestion.  He's been at home for the last three days. He is running fevers intermittently, mostly 99.6-99.9, some diarrhea. He denies cough. He has been at home since March 12th, his fiance tested negative for Covid-19 today. He is scheduled to receive his second Covid vaccine in early April.  He is curious about monoclonal antibody infusion.    Observations/Objective:  Alert and oriented. Appears well, not sickly. No distress. Speaking in complete sentences. No cough.  Assessment and Plan:  Interesting situation where he actually had Covid (without diagnosis) and received his vaccine the same day symptoms began.  Overall seems to be doing well. Will have to consult with the infusion center  to see if he is a good candidate of the monoclonial antibody treatment. Unsure given that he's had his vaccine and seems to be doing fairly well.  Discussed use of Tylenol PRN, fluids, rest. He will continue to quarantine, discussed that quarantine will end on March 22nd which is ten days from symptom onset.   Follow Up Instructions:  I will be in touch regarding the monoclonal antibody infusion.  Continue Tylenol as needed.  Ensure you are consuming 64 ounces of water daily.  It was a pleasure to see you today! [name], NP-C    I discussed the assessment and treatment plan with the patient. The patient was provided an opportunity to ask questions and all were answered. The patient agreed with the plan and demonstrated an understanding of the instructions.   The patient was advised to call back or seek an in-person evaluation if the symptoms worsen or if the condition fails to improve as anticipated.   [name], NP '  Review of Systems  Constitutional: Positive for chills, fatigue and fever.  HENT: Positive for congestion.   Respiratory: Positive for cough.   Gastrointestinal: Positive for diarrhea.       Past Medical History:  Diagnosis Date  . History of UTI   . Hydrocephalus (HCC)   . Lower extremity cellulitis left  . Pulmonary embolism (HCC)   . Spina bifida (HCC)   . Urine incontinence      Social History   Socioeconomic History  . Marital status: Single    Spouse name: Not on file  . Number of children:  Not on file  . Years of education: Not on file  . Highest education level: Not on file  Occupational History  . Not on file  Tobacco Use  . Smoking status: Never Smoker  . Smokeless tobacco: Never Used  Substance and Sexual Activity  . Alcohol use: Yes  . Drug use: No  . Sexual activity: Not on file  Other Topics Concern  . Not on file  Social History Narrative   Single.    No children.   Works as a Scientist, research (medical).    Enjoys video  games, reading, photography.   Social Determinants of Health   Financial Resource Strain:   . Difficulty of Paying Living Expenses:   Food Insecurity:   . Worried About Charity fundraiser in the Last Year:   . Arboriculturist in the Last Year:   Transportation Needs:   . Film/video editor (Medical):   [name] Lack of Transportation (Non-Medical):   Physical Activity:   . Days of Exercise per Week:   . Minutes of Exercise per Session:   Stress:   . Feeling of Stress :   Social Connections:   . Frequency of Communication with Friends and Family:   . Frequency of Social Gatherings with Friends and Family:   . Attends Religious Services:   . Active Member of Clubs or Organizations:   . Attends Archivist Meetings:   [name] Marital Status:   Intimate Partner Violence:   . Fear of Current or Ex-Partner:   . Emotionally Abused:   [name] Physically Abused:   . Sexually Abused:     Past Surgical History:  Procedure Laterality Date  . LEG SURGERY     X 5  . VENTRICULOPERITONEAL SHUNT      Family History  Problem Relation Age of Onset  . Hypertension Father   . Lung cancer Maternal Aunt   . Breast cancer Maternal Grandmother   . Mental illness Maternal Grandmother     Allergies  Allergen Reactions  . Latex Other (See Comments)  . Sulfa Antibiotics Other (See Comments) and Palpitations    Mind racing     Current Outpatient Medications on File Prior to Visit  Medication Sig Dispense Refill  . azelastine (ASTELIN) 0.1 % nasal spray Place 1 spray into both nostrils 2 (two) times daily.      No current facility-administered medications on file prior to visit.    Pulse (!) 103   Temp 99.8 F (37.7 C) (Temporal)   SpO2 99%    Objective:   Physical Exam  Constitutional: He is oriented to person, place, and time. He appears well-nourished.  Respiratory: Effort normal.  No cough on exam  Neurological: He is alert and oriented to person, place, and time.            Assessment & Plan:

## 2019-05-31 NOTE — Assessment & Plan Note (Signed)
Diagnosed today, symptoms began five days ago. Also received Covid vaccine five days ago.  Overall seems to be doing well. Will have to consult with the infusion center to see if he is a good candidate of the monoclonial antibody treatment. Unsure given that he's had his vaccine and seems to be doing fairly well.  Discussed use of Tylenol PRN, fluids, rest. He will continue to quarantine, discussed that quarantine will end on March 22nd which is ten days from symptom onset.

## 2019-05-31 NOTE — Telephone Encounter (Signed)
Called patient and talked to him about his eligibility for MOAB therapy.  After discussion, he does not meet criteria to receive the therapy.  He does not have any qualifying risk factors per FDA guidelines.    Lillard Anes, NP

## 2019-05-31 NOTE — Patient Instructions (Signed)
I will be in touch regarding the monoclonal antibody infusion.  Continue Tylenol as needed.  Ensure you are consuming 64 ounces of water daily.  It was a pleasure to see you today! [name], NP-C

## 2019-06-01 ENCOUNTER — Telehealth: Payer: Self-pay | Admitting: Primary Care

## 2019-06-01 NOTE — Telephone Encounter (Signed)
Pt tested positive for Covid on Monday 05/29/19, no symptoms at the time of being tested.  Pt has since developed SOB and fatigue x 1 day. Pt notes that his O2 sats are 99-100% every time he checks and his HR is anywhere from 75-100 BPM. I advised that Jae Dire is already gone for the day, pt stated that he felt that it was not something that could wait until tomorrow as the SOB is concerning him. Pt did not sound SOB during our conversation. Spoke briefly with Rena and we agreed that the best course of action given it being late in the day was for the patient to be evaluated by UC or ED since his SOB was concerning. Pt did not seem like he wanted to go to the either place. I explained to him that with it being 4:30pm and there being no appointment available that we could not see him today and if he felt his symptoms were too severe to wait until Jae Dire was available tomorrow morning then he really needed to be evaluated - they may need to listen to his lungs, might do xrays to rule out anything serious. Pt still did not sound like he was going. I asked that if he did not get checked out tonight that he call the office tomorrow morning to get an appt with Jae Dire at least. Patient stated that he would decide something.   Will send to Jae Dire as Lorain Childes and also Johny Drilling to follow up with the patient tomorrow morning.

## 2019-06-01 NOTE — Telephone Encounter (Signed)
Noted and appreciate the call. 

## 2019-06-01 NOTE — Telephone Encounter (Signed)
Noted, please check on patient tomorrow (06/02/19)

## 2019-06-02 NOTE — Telephone Encounter (Signed)
Noted and glad to hear! 

## 2019-06-02 NOTE — Telephone Encounter (Signed)
Spoken to patient earlier today. He stated that he believe that the SOB was his anxiety from knowing he has covid. His O2 stat is running from 98-100 and have a fever of 100.7 but overall feels okay. Will call if no improvement.

## 2020-09-10 ENCOUNTER — Telehealth: Payer: Self-pay | Admitting: *Deleted

## 2020-09-10 NOTE — Telephone Encounter (Signed)
Noted and agree. 

## 2020-09-10 NOTE — Telephone Encounter (Signed)
Patient called stating that he started with covid symptoms Saturday. [facility] and the results were positive today.  Patient stated that he has head congestion, sore throat and fatigue. Patient stated that he has not had a fever since Sunday night. Patient denied SOB or difficulty breathing. Patient stated that he has been checking his oxygen level and it has been running 99-100%. Patient stated that he is in quarantine. Patient was offered a virtual visit tomorrow which he declined stating that he is feeling better so will hold off on an appointment. Patient was advised to rest, drink lots of fluids and to treat his symptoms. Patient was given ER precautions and he verbalized understanding.

## 2022-03-27 ENCOUNTER — Encounter: Payer: BC Managed Care – PPO | Attending: Physician Assistant | Admitting: Physician Assistant

## 2022-03-27 DIAGNOSIS — L89893 Pressure ulcer of other site, stage 3: Secondary | ICD-10-CM | POA: Diagnosis present

## 2022-03-27 DIAGNOSIS — Z8614 Personal history of Methicillin resistant Staphylococcus aureus infection: Secondary | ICD-10-CM | POA: Insufficient documentation

## 2022-03-27 DIAGNOSIS — Z89512 Acquired absence of left leg below knee: Secondary | ICD-10-CM | POA: Diagnosis not present

## 2022-03-27 DIAGNOSIS — Q059 Spina bifida, unspecified: Secondary | ICD-10-CM | POA: Insufficient documentation

## 2022-03-27 NOTE — Progress Notes (Addendum)
[name], [name] ([id number]) [id number] Page 1 of 9 Visit Report for 03/27/2022 Allergy List Details Patient Name: Date of Service: [name] [name] 03/27/2022 8:30 A M Medical Record Number: [id number] Patient Account Number: [id number] Date of Birth/Sex: Treating RN: 14-Aug-1985 (36 y.o. [name]) [name] Primary Care [name]: [name] Other Clinician: Referring [name]: Treating Hassel Uphoff/Extender: [name] in Treatment: 0 Allergies Active Allergies latex Sulfa (Sulfonamide Antibiotics) Allergy Notes Electronic Signature(s) Signed: 03/27/2022 12:02:16 PM By: Jeffrey Aver MSN RN CNS WTA Previous Signature: 03/27/2022 9:54:39 AM Version By: Jeffrey Aver MSN RN CNS WTA Previous Signature: 03/27/2022 8:43:07 AM Version By: Jeffrey Pax RN Entered By: [name] on 03/27/2022 12:02:16 -------------------------------------------------------------------------------- Arrival Information Details Patient Name: Date of Service: Jeffrey Genre. 03/27/2022 8:30 A M Medical Record Number: [id number] Patient Account Number: [id number] Date of Birth/Sex: Treating RN: 1985-11-29 (36 y.o. [name] Primary Care Wave Calzada: [name] Other Clinician: Referring [name]: Treating [name]/Extender: [name] in Treatment: 0 Visit Information Patient Arrived: Crutches Arrival Time: 08:56 Accompanied By: self Transfer Assistance: None Patient Identification Verified: Yes Secondary Verification Process Completed: Yes Patient Requires Transmission-Based Precautions: No Patient Has Alerts: No Electronic Signature(s) [name] ([id number]) [id number] Page 2 of 9 Signed: 03/27/2022 12:01:51 PM By: Jeffrey Aver MSN RN CNS WTA Previous Signature: 03/27/2022 9:53:45 AM Version By: Jeffrey Aver MSN RN CNS WTA Entered By: [name] on 03/27/2022  12:01:51 -------------------------------------------------------------------------------- Clinic Level of Care Assessment Details Patient Name: Date of Service: [name], [name] 03/27/2022 8:30 A M Medical Record Number: [id number] Patient Account Number: [id number] Date of Birth/Sex: Treating RN: 1985/11/20 (36 y.o. [name] Primary Care [name]: [name] Other Clinician: Referring [name]: Treating [name]/Extender: [name] in Treatment: 0 Clinic Level of Care Assessment Items TOOL 1 Quantity Score X- 1 0 Use when EandM and Procedure is performed on INITIAL visit ASSESSMENTS - Nursing Assessment / Reassessment X- 1 20 General Physical Exam (combine w/ comprehensive assessment (listed just below) when performed on new pt. evals) X- 1 25 Comprehensive Assessment (HX, ROS, Risk Assessments, Wounds Hx, etc.) ASSESSMENTS - Wound and Skin Assessment / Reassessment []  - 0 Dermatologic / Skin Assessment (not related to wound area) ASSESSMENTS - Ostomy and/or Continence Assessment and Care []  - 0 Incontinence Assessment and Management []  - 0 Ostomy Care Assessment and Management (repouching, etc.) PROCESS - Coordination of Care X - Simple Patient / Family Education for ongoing care 1 15 []  - 0 Complex (extensive) Patient / Family Education for ongoing care X- 1 10 Staff obtains , Records, T Results / Process Orders est []  - 0 Staff telephones HHA, Nursing Homes / Clarify orders / etc []  - 0 Routine Transfer to another Facility (non-emergent condition) []  - 0 Routine Hospital Admission (non-emergent condition) X- 1 15 New Admissions / / Ordering NPWT Apligraf, etc. , []  - 0 Emergency Hospital Admission (emergent condition) PROCESS - Special Needs []  - 0 Pediatric / Minor Patient Management []  - 0 Isolation Patient Management []  - 0 Hearing / Language / Visual special needs []  - 0 Assessment  of Community assistance (transportation, D/Navarro planning, etc.) []  - 0 Additional assistance / Altered mentation []  - 0 Support Surface(s) Assessment (bed, cushion, seat, etc.) INTERVENTIONS - Miscellaneous []  - 0 External ear exam []  - 0 Patient Transfer (multiple staff / / Similar devices) []  - 0 Simple Staple / Suture removal (25 or less) [name], Jeffrey  Navarro ([id number]) [id number] Page 3 of 9 []  - 0 Complex Staple / Suture removal (26 or more) []  - 0 Hypo/Hyperglycemic Management (do not check if billed separately) []  - 0 Ankle / Brachial Index (ABI) - do not check if billed separately Has the patient been seen at the hospital within the last three years: Yes Total Score: 85 Level Of Care: New/Established - Level 3 Electronic Signature(s) Signed: 04/09/2022 4:58:20 PM By: Jeffrey Loud MSN RN CNS WTA Entered By: [name] on 03/27/2022 12:33:47 -------------------------------------------------------------------------------- Encounter Discharge Information Details Patient Name: Date of Service: Jeffrey Nash. 03/27/2022 8:30 A M Medical Record Number: [id number] Patient Account Number: [id number] Date of Birth/Sex: Treating RN: 1985/12/01 (37 y.o. [name] Primary Care [name]: [name] Other Clinician: Referring [name]: Treating [name]/Extender: [name] in Treatment: 0 Encounter Discharge Information Items Post Procedure Vitals Discharge Condition: Stable Temperature (F): 98.1 Ambulatory Status: Crutches Pulse (bpm): 121 Discharge Destination: Home Respiratory Rate (breaths/min): 16 Transportation: Private Auto Blood Pressure (mmHg): 162/102 Accompanied By: self Schedule Follow-up Appointment: Yes Clinical Summary of Care: Electronic Signature(s) Signed: 03/27/2022 12:36:59 PM By: Jeffrey Loud MSN RN CNS WTA Entered By: [name] on 03/27/2022  12:36:59 -------------------------------------------------------------------------------- Lower Extremity Assessment Details Patient Name: Date of Service: Jeffrey Nash. 03/27/2022 8:30 A M Medical Record Number: [id number] Patient Account Number: [id number] Date of Birth/Sex: Treating RN: September 09, 1985 (37 y.o. [name] Primary Care Jawara Latorre: [name] Other Clinician: Referring Kerim Statzer: Treating Antonietta Lansdowne/Extender: [name] in Treatment: 0 Electronic Signature(s) Signed: 03/27/2022 12:02:11 PM By: Jeffrey Loud MSN RN CNS WTA [name],Signed: 03/27/2022 12:02:11 PM By: Jeffrey Loud MSN RN CNS [name] ([id number]) [id number] Page 4 of 9 Previous Signature: 03/27/2022 9:54:05 AM Version By: Jeffrey Loud MSN RN CNS WTA Entered By: [name] on 03/27/2022 12:02:11 -------------------------------------------------------------------------------- Multi Wound Chart Details Patient Name: Date of Service: [place], [place]. 03/27/2022 8:30 A M Medical Record Number: [id number] Patient Account Number: [id number] Date of Birth/Sex: Treating RN: [birth year] (37 y.o. [name] Primary Care Isyss Espinal: [name] Other Clinician: Referring Lasonya Hubner: Treating Zaire Vanbuskirk/Extender: [name] in Treatment: 0 Vital Signs Height(in): Pulse(bpm): 121 Weight(lbs): Blood Pressure(mmHg): 163/102 Body Mass Index(BMI): Temperature(F): 98.1 Respiratory Rate(breaths/min): 16 [1:Photos:] [N/A:N/A] Left, Midline Amputation Site - Below N/A N/A Wound Location: Knee Gradually Appeared N/A N/A Wounding Event: Pressure Ulcer N/A N/A Primary Etiology: 01/24/2022 N/A N/A Date Acquired: 0 N/A N/A Weeks of Treatment: Open N/A N/A Wound Status: No N/A N/A Wound Recurrence: 1.7x2x1.7 N/A N/A Measurements L x W x D (cm) 2.67 N/A N/A A (cm) : rea 4.54 N/A N/A Volume (cm) : 12 Starting  Position 1 (o'clock): 12 Ending Position 1 (o'clock): 3 Maximum Distance 1 (cm): Yes N/A N/A Undermining: Category/Stage III N/A N/A Classification: Medium N/A N/A Exudate A mount: Sanguinous N/A N/A Exudate Type: red N/A N/A Exudate Color: Large (67-100%) N/A N/A Granulation A mount: Red, Friable N/A N/A Granulation Quality: Small (1-33%) N/A N/A Necrotic A mount: Fat Layer (Subcutaneous Tissue): Yes N/A N/A Exposed Structures: Fascia: No Tendon: No Muscle: No Joint: No Bone: No Medium (34-66%) N/A N/A Epithelialization: Debridement - Excisional N/A N/A Debridement: Subcutaneous, Slough N/A N/A Tissue Debrided: Skin/Subcutaneous Tissue N/A N/A Level: 3.4 N/A N/A Debridement A (sq cm): rea Curette N/A N/A Instrument: Moderate N/A N/A Bleeding: HUD, GIUSTI Navarro ([id number]) [id number] Page 5 of 9 Pressure N/A N/A Hemostasis Achieved: Procedure was tolerated well N/A N/A Debridement Treatment Response:  1.7x2x1.9 N/A N/A Post Debridement Measurements L x W x D (cm) 5.074 N/A N/A Post Debridement Volume: (cm) Category/Stage III N/A N/A Post Debridement Stage: Debridement N/A N/A Procedures Performed: Treatment Notes Electronic Signature(s) Signed: 03/27/2022 9:54:51 AM By: Jeffrey Loud MSN RN CNS WTA Entered By: [name] on 03/27/2022 09:54:50 -------------------------------------------------------------------------------- Multi-Disciplinary Care Plan Details Patient Name: Date of Service: Jeffrey Nash. 03/27/2022 8:30 A M Medical Record Number: [id number] Patient Account Number: [id number] Date of Birth/Sex: Treating RN: [birth year] (37 y.o. [name] Primary Care Lillianne Eick: [name] Other Clinician: Referring Thandiwe Siragusa: Treating Jaci Desanto/Extender: [name] in Treatment: 0 Active Inactive Necrotic Tissue Nursing Diagnoses: Impaired tissue integrity related to  necrotic/devitalized tissue Knowledge deficit related to management of necrotic/devitalized tissue Goals: Necrotic/devitalized tissue will be minimized in the wound bed Date Initiated: 03/27/2022 Target Resolution Date: 04/27/2022 Goal Status: Active Patient/caregiver will verbalize understanding of reason and process for debridement of necrotic tissue Date Initiated: 03/27/2022 Target Resolution Date: 04/27/2022 Goal Status: Active Interventions: Assess patient pain level pre-, during and post procedure and prior to discharge Provide education on necrotic tissue and debridement process Treatment Activities: Apply topical anesthetic as ordered : 03/27/2022 Excisional debridement : 03/27/2022 T ordered outside of clinic : 03/27/2022 est Notes: Orientation to the Wound Care Program Nursing Diagnoses: Knowledge deficit related to the wound healing center program Goals: Patient/caregiver will verbalize understanding of the [name] Date Initiated: 03/27/2022 Target Resolution Date: 04/25/2022 Goal Status: Active [name], [name] ([id number]) [id number] Page 6 of 9 Interventions: Provide education on orientation to the wound center Notes: Pressure Nursing Diagnoses: Knowledge deficit related to causes and risk factors for pressure ulcer development Knowledge deficit related to management of pressures ulcers Goals: Patient will remain free from development of additional pressure ulcers Date Initiated: 03/27/2022 Target Resolution Date: 04/27/2022 Goal Status: Active Patient will remain free of pressure ulcers Date Initiated: 03/27/2022 Target Resolution Date: 04/27/2022 Goal Status: Active Patient/caregiver will verbalize risk factors for pressure ulcer development Date Initiated: 03/27/2022 Target Resolution Date: 04/27/2022 Goal Status: Active Patient/caregiver will verbalize understanding of pressure ulcer management Date Initiated:  03/27/2022 Target Resolution Date: 04/27/2022 Goal Status: Active Interventions: Assess: immobility, friction, shearing, incontinence upon admission and as needed Assess offloading mechanisms upon admission and as needed Assess potential for pressure ulcer upon admission and as needed Provide education on pressure ulcers Treatment Activities: T ordered outside of clinic : 03/27/2022 est Notes: Wound/Skin Impairment Nursing Diagnoses: Impaired tissue integrity Knowledge deficit related to smoking impact on wound healing Knowledge deficit related to ulceration/compromised skin integrity Goals: Patient will have a decrease in wound volume by X% from date: (specify in notes) Date Initiated: 03/27/2022 Target Resolution Date: 04/27/2022 Goal Status: Active Patient/caregiver will verbalize understanding of skin care regimen Date Initiated: 03/27/2022 Target Resolution Date: 04/27/2022 Goal Status: Active Ulcer/skin breakdown will have a volume reduction of 30% by week 4 Date Initiated: 03/27/2022 Target Resolution Date: 04/27/2022 Goal Status: Active Ulcer/skin breakdown will have a volume reduction of 50% by week 8 Date Initiated: 03/27/2022 Target Resolution Date: 05/26/2022 Goal Status: Active Ulcer/skin breakdown will have a volume reduction of 80% by week 12 Date Initiated: 03/27/2022 Target Resolution Date: 06/26/2022 Goal Status: Active Ulcer/skin breakdown will heal within 14 weeks Date Initiated: 03/27/2022 Target Resolution Date: 07/10/2022 Goal Status: Active Interventions: Assess patient/caregiver ability to obtain necessary supplies Assess patient/caregiver ability to perform ulcer/skin care regimen upon admission and as needed Assess ulceration(s) every visit Provide education on ulcer and skin  care Treatment Activities: Skin care regimen initiated : 03/27/2022 Notes: Electronic Signature(s) Signed: 03/27/2022 12:32:58 PM By: Jeffrey Loud MSN RN CNS WTA [name],Signed:  03/27/2022 12:32:58 PM By: Jeffrey Loud MSN RN CNS [name] ([id number]) [id number] Page 7 of 9 Previous Signature: 03/27/2022 12:31:59 PM Version By: Jeffrey Loud MSN RN CNS WTA Entered By: [name] on 03/27/2022 12:32:58 -------------------------------------------------------------------------------- Pain Assessment Details Patient Name: Date of Service: [place], [name] 03/27/2022 8:30 A M Medical Record Number: [id number] Patient Account Number: [id number] Date of Birth/Sex: Treating RN: 1985-11-15 (37 y.o. [name] Primary Care Dakin Madani: [name] Other Clinician: Referring Tomisha Reppucci: Treating Jeri Rawlins/Extender: [name] in Treatment: 0 Active Problems Location of Pain Severity and Description of Pain Patient Has Paino No Site Locations Pain Management and Medication Current Pain Management: Electronic Signature(s) Signed: 03/27/2022 12:01:58 PM By: Jeffrey Loud MSN RN CNS WTA Previous Signature: 03/27/2022 9:53:49 AM Version By: Jeffrey Loud MSN RN CNS WTA Entered By: [name] on 03/27/2022 12:01:57 -------------------------------------------------------------------------------- Patient/Caregiver Education Details Patient Name: Date of Service: Jeffrey Nash 1/12/2024andnbsp8:30 Ingenio Record Number: [id number] Patient Account Number: [id number] [name], [name] ([id number]) [id number] Page 8 of 9 Date of Birth/Gender: Treating RN: [birth year] (37 y.o. [name] Primary Care Physician: [name] Other Clinician: Referring Physician: Treating Physician/Extender: [name] in Treatment: 0 Education Assessment Education Provided To: Patient Education Topics Provided Welcome T [facility] Packet: o Handouts: [place] o Methods: Explain/Verbal Wound Debridement: Handouts:  Wound Debridement Methods: Explain/Verbal Responses: State content correctly Wound/Skin Impairment: Methods: Explain/Verbal Responses: State content correctly Electronic Signature(s) Signed: 04/09/2022 4:58:20 PM By: Jeffrey Loud MSN RN CNS WTA Entered By: [name] on 03/27/2022 12:06:43 -------------------------------------------------------------------------------- Wound Assessment Details Patient Name: Date of Service: Jeffrey Nash. 03/27/2022 8:30 A M Medical Record Number: [id number] Patient Account Number: [id number] Date of Birth/Sex: Treating RN: [birth year] (37 y.o. [name] Primary Care Zareth Rippetoe: [name] Other Clinician: Referring Brenin Heidelberger: Treating Derreon Consalvo/Extender: [name] in Treatment: 0 Wound Status Wound Number: 1 Primary Etiology: Pressure Ulcer Wound Location: Left, Midline Amputation Site - Below Knee Wound Status: Open Wounding Event: Gradually Appeared Date Acquired: 01/24/2022 Weeks Of Treatment: 0 Clustered Wound: No Photos [name], [name] ([id number]) [id number] Page 9 of 9 Wound Measurements Length: (cm) 1.7 Width: (cm) 2 Depth: (cm) 1.7 Area: (cm) 2.67 Volume: (cm) 4.54 % Reduction in Area: % Reduction in Volume: Epithelialization: Medium (34-66%) Tunneling: No Undermining: Yes Starting Position (o'clock): 12 Ending Position (o'clock): 12 Maximum Distance: (cm) 3 Wound Description Classification: Category/Stage III Exudate Amount: Medium Exudate Type: Sanguinous Exudate Color: red Foul Odor After Cleansing: No Slough/Fibrino No Wound Bed Granulation Amount: Large (67-100%) Exposed Structure Granulation Quality: Red, Friable Fascia Exposed: No Necrotic Amount: Small (1-33%) Fat Layer (Subcutaneous Tissue) Exposed: Yes Tendon Exposed: No Muscle Exposed: No Joint Exposed: No Bone Exposed: No Electronic Signature(s) Signed: 04/09/2022 4:58:20 PM By:  Jeffrey Loud MSN RN CNS WTA Previous Signature: 03/27/2022 9:19:50 AM Version By: Worthy Keeler PA-Navarro Entered By: [name] on 03/27/2022 09:28:15 -------------------------------------------------------------------------------- Vitals Details Patient Name: Date of Service: Jeffrey Lowe Navarro. 03/27/2022 8:30 A M Medical Record Number: [id number] Patient Account Number: [id number] Date of Birth/Sex: Treating RN: 1985-09-17 (37 y.o. [name] Primary Care Malic Rosten: [name] Other Clinician: Referring Amour Trigg: Treating Gurinder Toral/Extender: [name] in Treatment: 0 Vital Signs Time Taken: 08:57 Temperature (F):  98.1 Pulse (bpm): 121 Respiratory Rate (breaths/min): 16 Blood Pressure (mmHg): 163/102 Reference Range: 80 - 120 mg / dl Electronic Signature(s) Signed: 03/27/2022 12:02:03 PM By: Jeffrey Loud MSN RN CNS WTA Previous Signature: 03/27/2022 9:53:53 AM Version By: Jeffrey Loud MSN RN CNS WTA Entered By: [name] on 03/27/2022 12:02:03

## 2022-03-31 ENCOUNTER — Ambulatory Visit
Admission: RE | Admit: 2022-03-31 | Discharge: 2022-03-31 | Disposition: A | Discharge status: Home / Self Care | Payer: BC Managed Care – PPO | Attending: Physician Assistant | Admitting: Physician Assistant

## 2022-03-31 ENCOUNTER — Ambulatory Visit
Admission: RE | Admit: 2022-03-31 | Discharge: 2022-03-31 | Disposition: A | Discharge status: Home / Self Care | Payer: BC Managed Care – PPO | Source: Ambulatory Visit | Attending: Physician Assistant | Admitting: Physician Assistant

## 2022-03-31 ENCOUNTER — Encounter: Payer: Self-pay | Admitting: Physician Assistant

## 2022-03-31 ENCOUNTER — Other Ambulatory Visit: Payer: Self-pay | Admitting: Physician Assistant

## 2022-03-31 DIAGNOSIS — T148XXA Other injury of unspecified body region, initial encounter: Secondary | ICD-10-CM | POA: Diagnosis not present

## 2022-03-31 DIAGNOSIS — L089 Local infection of the skin and subcutaneous tissue, unspecified: Secondary | ICD-10-CM

## 2022-04-03 ENCOUNTER — Ambulatory Visit: Payer: BC Managed Care – PPO | Admitting: Physician Assistant

## 2022-04-06 ENCOUNTER — Encounter: Payer: BC Managed Care – PPO | Admitting: Physician Assistant

## 2022-04-06 DIAGNOSIS — L89893 Pressure ulcer of other site, stage 3: Secondary | ICD-10-CM | POA: Diagnosis not present

## 2022-04-06 NOTE — Progress Notes (Addendum)
BRESLIN, HEMANN ([id number]) [id number] Page 1 of 6 Visit Report for 04/06/2022 Chief Complaint Document Details Patient Name: Date of Service: [name] 04/06/2022 10:00 A M Medical Record Number: [id number] Patient Account Number: [id number] Date of Birth/Sex: Treating RN: 1986/03/13 (37 y.o. Verl Blalock Primary Care Provider: [name] Other Clinician: Massie Kluver Referring Provider: Treating Provider/Extender: [name] in Treatment: 1 Information Obtained from: Patient Chief Complaint Pressure ulcer left BKA location Electronic Signature(s) Signed: 04/06/2022 10:22:47 AM By: Worthy Keeler PA-C Entered By: Worthy Keeler on 04/06/2022 10:22:47 -------------------------------------------------------------------------------- HPI Details Patient Name: Date of Service: [name]. 04/06/2022 10:00 A M Medical Record Number: [id number] Patient Account Number: [id number] Date of Birth/Sex: Treating RN: 1985-08-07 (37 y.o. 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 He states this began somewhere around January 24, 2022. Subsequently he has been seen by his primary care provider who did refer him to [place] I believe and subsequently they placed him on antibiotics that was for doxycycline 14 days on December 29. 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 There has not been any recent x-rays and I do believe he needs to get an x-ray before next week's follow-up appointment. Nonetheless this does actually go a little bit deeper than what I initially was hoping for and again it was obvious on the evaluation that something was not quite right. With that being said I do think that we can definitely start things as far as treatment is concerned and try to get this under control he does seem like this was more of a pressure and abrasion type issue in regard to his prosthesis and how it fits on his leg he does have signs of some abrasion and skin changes due to this on the distal portion of his stump of the left leg which he tells me has been going on for quite some time. I think his prosthesis may not be fitting quite right. Patient has a history of spina bifida but otherwise really no other major medical problems other than what he is dealing with currently. 04-06-2022 upon evaluation today patient appears to be doing well with regard to his wound. He is actually making some signs of good improvement which is great news and overall I am extremely pleased with where we stand today. There does not appear to be any evidence of active infection locally nor systemically at this time which is great news. No fevers, chills, nausea, vomiting, or diarrhea. [name] ([id number]) [id number] Page 2 of 6 Electronic Signature(s) Signed: 04/06/2022 11:23:53 AM By: Worthy Keeler PA-C Entered By: Worthy Keeler on 04/06/2022 11:23:53 -------------------------------------------------------------------------------- Physical Exam Details Patient Name: Date of Service: [name] 04/06/2022 10:00 A M Medical Record Number: [id number] Patient Account Number: [id number] Date of Birth/Sex: Treating RN: 1985-07-07 (37 y.o. Verl Blalock Primary Care Provider: [name] Other Clinician: Massie Kluver Referring Provider: Treating Provider/Extender:  Stone, Merceda Elks, Katherine Weeks in Treatment: 1 Constitutional Well-nourished and well-hydrated in no acute distress. Respiratory normal breathing without difficulty. Psychiatric this patient is able to make decisions and demonstrates good insight into disease process. Alert and Oriented x 3. pleasant and cooperative. Notes Upon inspection patient's wound bed actually showed signs of good granulation epithelization at this point. Fortunately I do not see any evidence of infection locally nor systemically which is great news. I do believe that he has made excellent progress I think packing with a Hydrofera Blue ready packing would be good at this point that I discussed that with him today as well. Electronic Signature(s) Signed: 04/06/2022 11:24:23 AM By: Lenda Kelp PA-C Entered By: Lenda Kelp on 04/06/2022 11:24:23 -------------------------------------------------------------------------------- Physician Orders Details Patient Name: Date of Service: [name]. 04/06/2022 10:00 A M Medical Record Number: [id number] Patient Account Number: [id number] Date of Birth/Sex: Treating RN: Jan 24, 1986 (36 y.o. Arthur Holms Primary Care Provider: Vernona Rieger Other Clinician: Betha Loa Referring Provider: Treating Provider/Extender: Robbie Louis in Treatment: 1 Verbal / Phone Orders: No Diagnosis Coding ICD-10 Coding Code Description [phone] Pressure ulcer of other site, stage 3 DENYS, SALINGER C ([id number]) [id number] Page 3 of 6 [phone] Acquired absence of left leg below knee Q05.9 Spina bifida, unspecified Follow-up Appointments Return Appointment in 1 week. Bathing/ Shower/ Hygiene May shower; gently cleanse wound with antibacterial soap, rinse and pat dry prior to dressing wounds Wound Treatment Wound #1 - Amputation Site - Below Knee Wound  Laterality: Left, Midline Cleanser: Soap and Water 1 x Per Day/30 Days Discharge Instructions: Gently cleanse wound with antibacterial soap, rinse and pat dry prior to dressing wounds Prim Dressing: Hydrofera Blue Ready Transfer Foam, 4x5 (in/in) 1 x Per Day/30 Days ary Discharge Instructions: cut a thin strip to gently pack into wound Secondary Dressing: ABD Pad 5x9 (in/in) 1 x Per Day/30 Days Discharge Instructions: Cover with ABD pad Secondary Dressing: Conforming Guaze Roll-Medium 1 x Per Day/30 Days Discharge Instructions: Apply Conforming Stretch Guaze Bandage as directed Secured With: Stretch Net Dressing, Latex-free, Size 5, Small-Head / Shoulder / Thigh 1 x Per Day/30 Days Electronic Signature(s) Signed: 04/06/2022 5:32:00 PM By: Lenda Kelp PA-C Signed: 04/07/2022 4:31:21 PM By: Betha Loa Entered By: Betha Loa on 04/06/2022 10:40:37 -------------------------------------------------------------------------------- Problem List Details Patient Name: Date of Service: [name]. 04/06/2022 10:00 A M Medical Record Number: [id number] Patient Account Number: [id number] Date of Birth/Sex: Treating RN: 06-May-1985 (36 y.o. Arthur Holms Primary Care Provider: Vernona Rieger Other Clinician: Betha Loa Referring Provider: Treating Provider/Extender: Robbie Louis in Treatment: 1 Active Problems ICD-10 Encounter Code Description Active Date MDM Diagnosis [phone] Pressure ulcer of other site, stage 3 03/27/2022 No Yes Z89.512 Acquired absence of left leg below knee 03/27/2022 No Yes Q05.9 Spina bifida, unspecified 03/27/2022 No Yes Inactive Problems Seymore, Terrie C ([id number]) [id number] Page 4 of 6 Resolved Problems Electronic Signature(s) Signed: 04/06/2022 10:22:45 AM By: Lenda Kelp PA-C Entered By: Lenda Kelp on 04/06/2022  10:22:44 -------------------------------------------------------------------------------- Progress Note Details Patient Name: Date of Service: [name]. 04/06/2022 10:00 A M Medical Record Number: [id number] Patient Account Number: [id number] Date of Birth/Sex: Treating RN: March 12, 1986 (36 y.o. 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 He states this began somewhere around January 24, 2022. Subsequently he has been seen by his primary care provider who did refer him to [place] I believe and subsequently they placed him on antibiotics that was for doxycycline 14 days on December 29. 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 There has not been any recent x-rays and I do believe he needs to get an x-ray before next week's follow- up appointment. Nonetheless this does actually go a little bit deeper than what I initially was hoping for and again it was obvious on the evaluation that something was not quite right. With that being said I do think that we can definitely start things as far as treatment is concerned and try to get this under control he does seem like this was more of a pressure and abrasion type issue in regard to his prosthesis and how it fits on his leg he does have signs of some abrasion and skin changes due to this on the  distal portion of his stump of the left leg which he tells me has been going on for quite some time. I think his prosthesis may not be fitting quite right. Patient has a history of spina bifida but otherwise really no other major medical problems other than what he is dealing with currently. 04-06-2022 upon evaluation today patient appears to be doing well with regard to his wound. He is actually making some signs of good improvement which is great news and overall I am extremely pleased with where we stand today. There does not appear to be any evidence of active infection locally nor systemically at this time which is great news. No fevers, chills, nausea, vomiting, or diarrhea. Objective Constitutional Well-nourished and well-hydrated in no acute distress. Vitals Time Taken: 10:20 AM, Temperature: 98.1 F, Pulse: 120 bpm, Respiratory Rate: 16 breaths/min, Blood Pressure: 174/110 mmHg. General Notes: Patient has white coat syndrome Provider notified of high bp Respiratory normal breathing without difficulty. Psychiatric this patient is able to make decisions and demonstrates good insight into disease process. Alert and Oriented x 3. pleasant and cooperative. General Notes: Upon inspection patient's wound bed actually showed signs of good granulation epithelization at this point. Fortunately I do not see any evidence of infection locally nor systemically which is great news. I do believe that he has made excellent progress I think packing with a Hydrofera Blue ready packing would be good at this point that I discussed that with him today as well. SETH, HIGGINBOTHAM ([id number]) [id number] Page 5 of 6 Integumentary (Hair, Skin) Wound #1 status is Open. Original cause of wound was Gradually Appeared. The date acquired was: 01/24/2022. The wound has been in treatment 1 weeks. The wound is located on the Left,Midline Amputation Site - Below Knee. The wound measures 0.3cm  length x 0.3cm width x 1cm depth; 0.071cm^2 area and 0.071cm^3 volume. There is Fat Layer (Subcutaneous Tissue) exposed. There is tunneling at 12:00 with a maximum distance of 2cm. There is a medium amount of sanguinous drainage noted. There is large (67-100%) red, friable granulation within the wound bed. There is  a small (1-33%) amount of necrotic tissue within the wound bed. Assessment Active Problems ICD-10 Pressure ulcer of other site, stage 3 Acquired absence of left leg below knee Spina bifida, unspecified Plan Follow-up Appointments: Return Appointment in 1 week. Bathing/ Shower/ Hygiene: May shower; gently cleanse wound with antibacterial soap, rinse and pat dry prior to dressing wounds WOUND #1: - Amputation Site - Below Knee Wound Laterality: Left, Midline Cleanser: Soap and Water 1 x Per Day/30 Days Discharge Instructions: Gently cleanse wound with antibacterial soap, rinse and pat dry prior to dressing wounds Prim Dressing: Hydrofera Blue Ready Transfer Foam, 4x5 (in/in) 1 x Per Day/30 Days ary Discharge Instructions: cut a thin strip to gently pack into wound Secondary Dressing: ABD Pad 5x9 (in/in) 1 x Per Day/30 Days Discharge Instructions: Cover with ABD pad Secondary Dressing: Conforming Guaze Roll-Medium 1 x Per Day/30 Days Discharge Instructions: Apply Conforming Stretch Guaze Bandage as directed Secured With: Stretch Net Dressing, Latex-free, Size 5, Small-Head / Shoulder / Thigh 1 x Per Day/30 Days 1. Based on what I am seeing I do believe switching to [facility] ready over the packing strip could be better currently as there is not as much undermining as it was previous. He is actually in agreement with that plan and good to give that a shot. With that being said I think that he should do well with an ABD pad to cover and again he is changing this 1 time per day which I think is still absolutely appropriate. 2. Also can recommend that he make sure not to over  pack he does need to keep in mind that he wants to allow [place] to fill him so I do not want to pack too tightly but again I think he will probably do quite well with the [facility] in that regard. We will see patient back for reevaluation in 1 week here in the clinic. If anything worsens or changes patient will contact our office for additional recommendations. Electronic Signature(s) Signed: 04/06/2022 11:25:04 AM By: Lenda Kelp PA-C Entered By: Lenda Kelp on 04/06/2022 11:25:03 -------------------------------------------------------------------------------- SuperBill Details Patient Name: Date of Service: [name] 04/06/2022 Medical Record Number: [id number] Patient Account Number: [id number] Date of Birth/Sex: Treating RN: 20-Mar-1985 (36 y.o. Arthur Holms Primary Care Provider: Vernona Rieger Other Clinician: Betha Loa Referring Provider: Treating Provider/Extender: Robbie Louis in Treatment: 1 Diagnosis Coding TONNY, ISENSEE ([id number]) [id number] Page 6 of 6 ICD-10 Codes Code Description [phone] Pressure ulcer of other site, stage 3 Z89.512 Acquired absence of left leg below knee Q05.9 Spina bifida, unspecified Facility Procedures : CPT4 Code: [id number] Description: 99213 - WOUND CARE VISIT-LEV 3 EST PT Modifier: Quantity: 1 Physician Procedures : CPT4 Code Description Modifier [id number] 99213 - WC PHYS LEVEL 3 - EST PT ICD-10 Diagnosis Description L89.893 Pressure ulcer of other site, stage 3 Z89.512 Acquired absence of left leg below knee Q05.9 Spina bifida, unspecified Quantity: 1 Electronic Signature(s) Signed: 04/06/2022 1:28:23 PM By: Lenda Kelp PA-C Entered By: Lenda Kelp on 04/06/2022 13:28:23

## 2022-04-07 NOTE — Progress Notes (Signed)
[name], [name] ([id number]) [id number] Page 1 of 10 Visit Report for 04/06/2022 Arrival Information Details Patient Name: Date of Service: [name] 04/06/2022 10:00 A M Medical Record Number: [id number] Patient Account Number: [id number] Date of Birth/Sex: Treating RN: 1985-08-30 (37 y.o. Verl Blalock Primary Care Darrill Vreeland: [name] Other Clinician: Massie Kluver Referring Opie Maclaughlin: Treating Ilya Ess/Extender: [name] in Treatment: 1 Visit Information History Since Last Visit All ordered tests and consults were completed: No Patient Arrived: Crutches Added or deleted any medications: No Arrival Time: 10:10 Any new allergies or adverse reactions: No Transfer Assistance: None Had a fall or experienced change in No Patient Identification Verified: Yes activities of daily living that may affect Secondary Verification Process Completed: Yes risk of falls: Patient Requires Transmission-Based Precautions: No Signs or symptoms of abuse/neglect since last visito No Patient Has Alerts: No Hospitalized since last visit: No Implantable device outside of the clinic excluding No cellular tissue based products placed in the center since last visit: Has Dressing in Place as Prescribed: Yes Pain Present Now: No Electronic Signature(s) Signed: 04/07/2022 4:31:21 PM By: Massie Kluver Entered By: Massie Kluver on 04/06/2022 10:20:02 -------------------------------------------------------------------------------- Clinic Level of Care Assessment Details Patient Name: Date of Service: [name] 04/06/2022 10:00 A M Medical Record Number: [id number] Patient Account Number: [id number] Date of Birth/Sex: Treating RN: 12-12-1985 (37 y.o. Verl Blalock Primary Care Alizeh Madril: [name] Other Clinician: Massie Kluver Referring Albi Rappaport: Treating Muhamed Luecke/Extender: [name] in  Treatment: 1 Clinic Level of Care Assessment Items TOOL 4 Quantity Score []  - 0 Use when only an EandM is performed on FOLLOW-UP visit ASSESSMENTS - Nursing Assessment / Reassessment X- 1 10 Reassessment of Co-morbidities (includes updates in patient status) X- 1 5 Reassessment of Adherence to Treatment Plan STIRLING, ORTON ([id number]) [id number] Page 2 of 10 ASSESSMENTS - Wound and Skin A ssessment / Reassessment X - Simple Wound Assessment / Reassessment - one wound 1 5 []  - 0 Complex Wound Assessment / Reassessment - multiple wounds []  - 0 Dermatologic / Skin Assessment (not related to wound area) ASSESSMENTS - Focused Assessment []  - 0 Circumferential Edema Measurements - multi extremities []  - 0 Nutritional Assessment / Counseling / Intervention []  - 0 Lower Extremity Assessment (monofilament, tuning fork, pulses) []  - 0 Peripheral Arterial Disease Assessment (using hand held doppler) ASSESSMENTS - Ostomy and/or Continence Assessment and Care []  - 0 Incontinence Assessment and Management []  - 0 Ostomy Care Assessment and Management (repouching, etc.) PROCESS - Coordination of Care X - Simple Patient / Family Education for ongoing care 1 15 []  - 0 Complex (extensive) Patient / Family Education for ongoing care []  - 0 Staff obtains Programmer, systems, Records, T Results / Process Orders est []  - 0 Staff telephones HHA, Nursing Homes / Clarify orders / etc []  - 0 Routine Transfer to another Facility (non-emergent condition) []  - 0 Routine Hospital Admission (non-emergent condition) []  - 0 New Admissions / Biomedical engineer / Ordering NPWT Apligraf, etc. , []  - 0 Emergency Hospital Admission (emergent condition) X- 1 10 Simple Discharge Coordination []  - 0 Complex (extensive) Discharge Coordination PROCESS - Special Needs []  - 0 Pediatric / Minor Patient Management []  - 0 Isolation Patient Management []  - 0 Hearing / Language /  Visual special needs []  - 0 Assessment of Community assistance (transportation, D/C planning, etc.) []  - 0 Additional assistance / Altered mentation []  - 0 Support Surface(s) Assessment (bed, cushion, seat, etc.) INTERVENTIONS -  Wound Cleansing / Measurement X - Simple Wound Cleansing - one wound 1 5 []  - 0 Complex Wound Cleansing - multiple wounds X- 1 5 Wound Imaging (photographs - any number of wounds) []  - 0 Wound Tracing (instead of photographs) X- 1 5 Simple Wound Measurement - one wound []  - 0 Complex Wound Measurement - multiple wounds INTERVENTIONS - Wound Dressings []  - 0 Small Wound Dressing one or multiple wounds X- 1 15 Medium Wound Dressing one or multiple wounds []  - 0 Large Wound Dressing one or multiple wounds []  - 0 Application of Medications - topical []  - 0 Application of Medications - injection INTERVENTIONS - Miscellaneous []  - 0 External ear exam JUNG, YURCHAK ( ) [id number] Page 3 of 10 []  - 0 Specimen Collection (cultures, biopsies, blood, body fluids, etc.) []  - 0 Specimen(s) / Culture(s) sent or taken to Lab for analysis []  - 0 Patient Transfer (multiple staff / Lift / Similar devices) []  - 0 Simple Staple / Suture removal (25 or less) []  - 0 Complex Staple / Suture removal (26 or more) []  - 0 Hypo / Hyperglycemic Management (close monitor of Blood Glucose) []  - 0 Ankle / Brachial Index (ABI) - do not check if billed separately X- 1 5 Vital Signs Has the patient been seen at the hospital within the last three years: Yes Total Score: 80 Level Of Care: New/Established - Level 3 Electronic Signature(s) Signed: 04/07/2022 4:31:21 PM By: Entered By: on 04/06/2022 10:41:22 -------------------------------------------------------------------------------- Encounter Discharge Information Details Patient Name: Date of Service: [name]. 04/06/2022 10:00 A  M Medical Record Number: 06-18-1994 Patient Account Number: Date of Birth/Sex: Treating RN: 06/22/1985 (37 y.o. , Michiel Sites Primary Care Mikey Maffett: Other Clinician: Referring Baraa Tubbs: Treating Anjolina Byrer/Extender: in Treatment: 1 Encounter Discharge Information Items Discharge Condition: Stable Ambulatory Status: Crutches Discharge Destination: Home Transportation: Private Auto Accompanied By: self Schedule Follow-up Appointment: Yes Clinical Summary of Care: Electronic Signature(s) Signed: 04/07/2022 4:31:21 PM By: 04/09/2022 Entered By: Betha Loa on 04/06/2022 10:51:42 Lower Extremity Assessment Details -------------------------------------------------------------------------------- 04/08/2022 (Cresenciano Genre) [id number] Page 4 of 10 Patient Name: Date of Service: ANAYA, [id number] 04/06/2022 10:00 A M Medical Record Number: Loel Lofty Patient Account Number: Selena Batten Date of Birth/Sex: Treating RN: 1985-06-07 (36 y.o. Betha Loa Primary Care Davanee Klinkner: Robbie Louis Other Clinician: 04/09/2022 Referring Patirica Longshore: Treating Aleksei Goodlin/Extender: Betha Loa in Treatment: 1 Electronic Signature(s) Signed: 04/06/2022 1:27:28 PM By: 04/08/2022 BSN, RN, CWS, Kim RN, BSN Signed: 04/07/2022 4:31:21 PM By: [id number] Entered By: 06-18-1994 on 04/06/2022 10:30:07 -------------------------------------------------------------------------------- Multi Wound Chart Details Patient Name: Date of Service: [place]. 04/06/2022 10:00 A M Medical Record Number: [id number] Patient Account Number: [id number] Date of Birth/Sex: Treating RN: [birth year] (36 y.o. Arthur Holms Primary Care Santosh Petter: Vernona Rieger Other Clinician: Betha Loa Referring Therman Hughlett: Treating Damaya Channing/Extender: Robbie Louis in  Treatment: 1 Vital Signs Height(in): Pulse(bpm): 120 Weight(lbs): Blood Pressure(mmHg): 174/110 Body Mass Index(BMI): Temperature(F): 98.1 Respiratory Rate(breaths/min): 16 [1:Photos:] [N/A:N/A] Left, Midline Amputation Site - Below N/A N/A Wound Location: Knee Gradually Appeared N/A N/A Wounding Event: Pressure Ulcer N/A N/A Primary Etiology: 01/24/2022 N/A N/A Date Acquired: 1 N/A N/A Weeks of Treatment: Open N/A N/A Wound Status: No N/A N/A Wound Recurrence: 0.3x0.3x0.5 N/A N/A Measurements L x W x D (cm) 0.071 N/A N/A A (cm) : rea 0.035 N/A N/A Volume (cm) :  97.30% N/A N/A % Reduction in A rea: 99.20% N/A N/A % Reduction in Volume: 12 Position 1 (o'clock): 2 Maximum Distance 1 (cm): Yes N/A N/A Tunneling: Category/Stage III N/A N/A Classification: Medium N/A N/A Exudate A mount: Sanguinous N/A N/A Exudate Type: red N/A N/A Exudate Color: Large (67-100%) N/A N/A Granulation A mount: Red, Friable N/A N/A Granulation Quality: Small (1-33%) N/A N/A Necrotic A mount: Fat Layer (Subcutaneous Tissue): Yes N/A N/A Exposed Structures: PEYTEN, WEARE ([id number]) [id number] Page 5 of 10 Fascia: No Tendon: No Muscle: No Joint: No Bone: No Medium (34-66%) N/A N/A Epithelialization: Treatment Notes Electronic Signature(s) Signed: 04/07/2022 4:31:21 PM By: Betha Loa Entered By: Betha Loa on 04/06/2022 10:30:40 -------------------------------------------------------------------------------- Multi-Disciplinary Care Plan Details Patient Name: Date of Service: Cresenciano Genre. 04/06/2022 10:00 A M Medical Record Number: [id number] Patient Account Number: [id number] Date of Birth/Sex: Treating RN: 11-24-1985 (36 y.o. Loel Lofty, Selena Batten Primary Care Laylia Mui: Vernona Rieger Other Clinician: Betha Loa Referring Nickoli Bagheri: Treating Dashayla Theissen/Extender: Robbie Louis in Treatment: 1 Active  Inactive Necrotic Tissue Nursing Diagnoses: Impaired tissue integrity related to necrotic/devitalized tissue Knowledge deficit related to management of necrotic/devitalized tissue Goals: Necrotic/devitalized tissue will be minimized in the wound bed Date Initiated: 03/27/2022 Target Resolution Date: 04/27/2022 Goal Status: Active Patient/caregiver will verbalize understanding of reason and process for debridement of necrotic tissue Date Initiated: 03/27/2022 Target Resolution Date: 04/27/2022 Goal Status: Active Interventions: Assess patient pain level pre-, during and post procedure and prior to discharge Provide education on necrotic tissue and debridement process Treatment Activities: Apply topical anesthetic as ordered : 03/27/2022 Excisional debridement : 03/27/2022 T ordered outside of clinic : 03/27/2022 est Notes: Orientation to the Wound Care Program Nursing Diagnoses: Knowledge deficit related to the wound healing center program Goals: Patient/caregiver will verbalize understanding of the Wound Healing Center Program Date Initiated: 03/27/2022 Target Resolution Date: 04/25/2022 Goal Status: Active Interventions: RYVER, POBLETE ([id number]) [id number] Page 6 of 10 Provide education on orientation to the wound center Notes: Pressure Nursing Diagnoses: Knowledge deficit related to causes and risk factors for pressure ulcer development Knowledge deficit related to management of pressures ulcers Goals: Patient will remain free from development of additional pressure ulcers Date Initiated: 03/27/2022 Target Resolution Date: 04/27/2022 Goal Status: Active Patient will remain free of pressure ulcers Date Initiated: 03/27/2022 Target Resolution Date: 04/27/2022 Goal Status: Active Patient/caregiver will verbalize risk factors for pressure ulcer development Date Initiated: 03/27/2022 Target Resolution Date: 04/27/2022 Goal Status:  Active Patient/caregiver will verbalize understanding of pressure ulcer management Date Initiated: 03/27/2022 Target Resolution Date: 04/27/2022 Goal Status: Active Interventions: Assess: immobility, friction, shearing, incontinence upon admission and as needed Assess offloading mechanisms upon admission and as needed Assess potential for pressure ulcer upon admission and as needed Provide education on pressure ulcers Treatment Activities: T ordered outside of clinic : 03/27/2022 est Notes: Wound/Skin Impairment Nursing Diagnoses: Impaired tissue integrity Knowledge deficit related to smoking impact on wound healing Knowledge deficit related to ulceration/compromised skin integrity Goals: Patient will have a decrease in wound volume by X% from date: (specify in notes) Date Initiated: 03/27/2022 Target Resolution Date: 04/27/2022 Goal Status: Active Patient/caregiver will verbalize understanding of skin care regimen Date Initiated: 03/27/2022 Target Resolution Date: 04/27/2022 Goal Status: Active Ulcer/skin breakdown will have a volume reduction of 30% by week 4 Date Initiated: 03/27/2022 Target Resolution Date: 04/27/2022 Goal Status: Active Ulcer/skin breakdown will have a volume reduction of 50% by week 8 Date Initiated: 03/27/2022 Target Resolution Date: 05/26/2022 Goal Status: Active Ulcer/skin  breakdown will have a volume reduction of 80% by week 12 Date Initiated: 03/27/2022 Target Resolution Date: 06/26/2022 Goal Status: Active Ulcer/skin breakdown will heal within 14 weeks Date Initiated: 03/27/2022 Target Resolution Date: 07/10/2022 Goal Status: Active Interventions: Assess patient/caregiver ability to obtain necessary supplies Assess patient/caregiver ability to perform ulcer/skin care regimen upon admission and as needed Assess ulceration(s) every visit Provide education on ulcer and skin care Treatment Activities: Skin care regimen initiated :  03/27/2022 Notes: Electronic Signature(s) Signed: 04/06/2022 1:27:28 PM By: Elliot Gurney, BSN, RN, CWS, Kim RN, BSN Signed: 04/07/2022 4:31:21 PM By: Betha Loa Hupp,Signed: 04/07/2022 4:31:21 PM By: Arlyss Repress ([id number]) [id number] Page 7 of 10 Entered By: Betha Loa on 04/06/2022 10:41:57 -------------------------------------------------------------------------------- Pain Assessment Details Patient Name: Date of Service: Rykar, Lebleu [place] 04/06/2022 10:00 A M Medical Record Number: [id number] Patient Account Number: [id number] Date of Birth/Sex: Treating RN: 10-Apr-1985 (36 y.o. Arthur Holms Primary Care Geraldene Eisel: Vernona Rieger Other Clinician: Betha Loa Referring Jodell Weitman: Treating Ella Golomb/Extender: Robbie Louis in Treatment: 1 Active Problems Location of Pain Severity and Description of Pain Patient Has Paino No Site Locations Pain Management and Medication Current Pain Management: Electronic Signature(s) Signed: 04/06/2022 1:27:28 PM By: Elliot Gurney, BSN, RN, CWS, Kim RN, BSN Signed: 04/07/2022 4:31:21 PM By: Betha Loa Entered By: Betha Loa on 04/06/2022 10:23:40 -------------------------------------------------------------------------------- Patient/Caregiver Education Details Patient Name: Date of Service: Naill, MA TTHEW C. 1/22/2024andnbsp10:00 A M Medical Record Number: [id number] Patient Account Number: [id number] DAVIS, AMBROSINI ([id number]) [id number] Page 8 of 10 Date of Birth/Gender: Treating RN: 23-Feb-1986 (36 y.o. Arthur Holms Primary Care Physician: Vernona Rieger Other Clinician: Betha Loa Referring Physician: Treating Physician/Extender: Robbie Louis in Treatment: 1 Education Assessment Education Provided To: Patient Education Topics Provided Wound/Skin Impairment: Handouts: Other: continue wound care as  directed Methods: Explain/Verbal Responses: State content correctly Electronic Signature(s) Signed: 04/07/2022 4:31:21 PM By: Betha Loa Entered By: Betha Loa on 04/06/2022 10:41:53 -------------------------------------------------------------------------------- Wound Assessment Details Patient Name: Date of Service: Cresenciano Genre. 04/06/2022 10:00 A M Medical Record Number: [id number] Patient Account Number: [id number] Date of Birth/Sex: Treating RN: [birth year] (36 y.o. Loel Lofty, Selena Batten Primary Care Opha Mcghee: Vernona Rieger Other Clinician: Betha Loa Referring Charm Stenner: Treating Jessyka Austria/Extender: Robbie Louis in Treatment: 1 Wound Status Wound Number: 1 Primary Etiology: Pressure Ulcer Wound Location: Left, Midline Amputation Site - Below Knee Wound Status: Open Wounding Event: Gradually Appeared Date Acquired: 01/24/2022 Weeks Of Treatment: 1 Clustered Wound: No Photos Wound Measurements Length: (cm) 0.3 Width: (cm) 0.3 Depth: (cm) 1 Area: (cm) 0.071 Volume: (cm) 0.071 Runions, Helmuth C ([id number]) % Reduction in Area: 97.3% % Reduction in Volume: 98.4% Epithelialization: Medium (34-66%) Tunneling: Yes Position (o'clock): 12 Maximum Distance: (cm) 2 [id number] Page 9 of 10 Wound Description Classification: Category/Stage III Exudate Amount: Medium Exudate Type: Sanguinous Exudate Color: red Foul Odor After Cleansing: No Slough/Fibrino No Wound Bed Granulation Amount: Large (67-100%) Exposed Structure Granulation Quality: Red, Friable Fascia Exposed: No Necrotic Amount: Small (1-33%) Fat Layer (Subcutaneous Tissue) Exposed: Yes Tendon Exposed: No Muscle Exposed: No Joint Exposed: No Bone Exposed: No Treatment Notes Wound #1 (Amputation Site - Below Knee) Wound Laterality: Left, Midline Cleanser Soap and Water Discharge Instruction: Gently cleanse wound with antibacterial soap, rinse  and pat dry prior to dressing wounds Peri-Wound Care Topical Primary Dressing Hydrofera Blue Ready Transfer Foam, 4x5 (in/in) Discharge Instruction: cut a thin strip to gently pack into wound Secondary Dressing ABD Pad 5x9 (in/in) Discharge  Instruction: Cover with ABD pad Allison Roll-Medium Discharge Instruction: Apply Conforming Stretch Guaze Bandage as directed Secured With [employer], Latex-free, Size 5, Small-Head / Shoulder / Thigh Compression Wrap Compression Stockings Add-Ons Electronic Signature(s) Signed: 04/06/2022 1:27:28 PM By: Gretta Cool, BSN, RN, CWS, Kim RN, BSN Signed: 04/07/2022 4:31:21 PM By: Massie Kluver Entered By: Massie Kluver on 04/06/2022 10:35:27 -------------------------------------------------------------------------------- Vitals Details Patient Name: Date of Service: Ali Lowe C. 04/06/2022 10:00 A M Medical Record Number: [id number] Patient Account Number: [id number] Date of Birth/Sex: Treating RN: 01-03-1986 (37 y.o. Verl Blalock Primary Care Markham Dumlao: [name] Other Clinician: Massie Kluver Referring Jimi Giza: Treating Clair Bardwell/Extender: [name] in Treatment: 1 [place], [name] ([id number]) [id number] Page 10 of 10 Vital Signs Time Taken: 10:20 Temperature (F): 98.1 Pulse (bpm): 120 Respiratory Rate (breaths/min): 16 Blood Pressure (mmHg): 174/110 Reference Range: 80 - 120 mg / dl Notes Patient has white coat syndrome Latessa Tillis notified of high bp Electronic Signature(s) Signed: 04/07/2022 4:31:21 PM By: Massie Kluver Entered By: Massie Kluver on 04/06/2022 10:23:35

## 2022-04-09 NOTE — Progress Notes (Signed)
Jeffrey [name], Burris Navarro ([id number]) [id number] Page 1 of 9 Visit Report for 03/27/2022 Chief Complaint Document Details Patient Name: Date of Service: Jeffrey [name], [name] [name]. 03/27/2022 8:30 A M Medical Record Number: [id number] Patient Account Number: [id number] Date of Birth/Sex: Treating RN: [birth year] (36 y.o. Jeffrey [name]) Smith, Vicki Primary Care Provider: Vernona [name], Katherine Other Clinician: Referring Provider: Treating Provider/Extender: Jeffrey [name], Jeffrey [name], Katherine Weeks in Treatment: 0 Information Obtained from: Patient Chief Complaint Pressure ulcer left BKA location Electronic Signature(s) Signed: 03/27/2022 9:22:38 AM By: Lenda KelpStone Navarro, Jeffrey Giovanetti PA-Navarro Entered By: Lenda KelpStone Navarro, Shereen Marton on 03/27/2022 09:22:38 -------------------------------------------------------------------------------- Debridement Details Patient Name: Date of Service: Jeffrey [name], Jeffrey [name]. 03/27/2022 8:30 A M Medical Record Number: [id number] Patient Account Number: [id number] Date of Birth/Sex: Treating RN: [birth year] (36 y.o. Jeffrey [name]) Smith, Vicki Primary Care Provider: Vernona [name], Katherine Other Clinician: Referring Provider: Treating Provider/Extender: Jeffrey [name], Danyia Borunda Navarro, Katherine Weeks in Treatment: 0 Debridement Performed for Assessment: Wound #1 Left,Midline Amputation Site - Below Knee Performed By: Physician Jeffrey [name], Jeffrey Lok E., PA-Navarro Debridement Type: Debridement Level of Consciousness (Pre-procedure): Awake and Alert Pre-procedure Verification/Time Out No Taken: T Area Debrided (L x W): otal 1.7 (cm) x 2 (cm) = 3.4 (cm) Tissue and other material debrided: Viable, Non-Viable, Slough, Subcutaneous, Slough Level: Skin/Subcutaneous Tissue Debridement Description: Excisional Instrument: Curette Bleeding: Moderate Hemostasis Achieved: Pressure Response to Treatment: Procedure was tolerated well Level of Consciousness (Post- Awake and Alert procedure): Post Debridement Measurements of Total Wound [name], Jeffrey [name] ([id number]) [id number] Page 2 of 9 Length: (cm) 1.7 Stage: Category/Stage Navarro Width: (cm) 2 Depth: (cm) 1.9 Volume: (cm) 5.074 Character of Wound/Ulcer Post Debridement: Stable Post Procedure Diagnosis Same as Pre-procedure Electronic Signature(s) Signed: 03/27/2022 1:28:04 PM By: Lenda KelpStone Navarro, Heylee Tant PA-Navarro Signed: 04/09/2022 4:58:20 PM By: Midge AverSmith, Vicki MSN RN CNS WTA Entered By: Midge AverSmith, Vicki on 03/27/2022 09:37:27 -------------------------------------------------------------------------------- HPI Details Patient Name: Date of Service: Jeffrey [name], Jeffrey [name] [name]. 03/27/2022 8:30 A M Medical Record Number: [id number] Patient Account Number: [id number] Date of Birth/Sex: Treating RN: [birth year] (36 y.o. 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 He states this began somewhere around January 24, 2022. Subsequently he has been seen by his primary care provider who did refer him to us I believe and subsequently they placed him on antibiotics that was for doxycycline 14 days on December [name]. 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 There has not been any recent x-rays and I do believe he needs to get an x-ray before next week's follow-up appointment. Nonetheless this does actually go a little bit deeper than what I initially was hoping for and again it was obvious on the evaluation that something was not quite right.  With that being said I do think that we can definitely start things as far as treatment is concerned and try to get this under control he does seem like this was more of a pressure and abrasion type issue in regard to his prosthesis and how it fits on his leg he does have signs of  some abrasion and skin changes due to this on the distal portion of his stump of the left leg which he tells me has been going on for quite some time. I think his prosthesis may not be fitting quite right. Patient has a history of spina bifida but otherwise really no other major medical problems other than what he is dealing with currently. Electronic Signature(s) Signed: 03/27/2022 1:24:[name] PM By: Worthy Keeler PA-Navarro Entered By: Worthy Keeler on 03/27/2022 13:24:[name] -------------------------------------------------------------------------------- Physical Exam Details Patient Name: Date of Service: Jeffrey [name], Jeffrey [name] 03/27/2022 8:30 A M Medical Record Number: [id number] Patient Account Number: [id number] Date of Birth/Sex: Treating RN: [birth year] Jeffrey [name] (37 y.o. Jeffrey [name], Jeffrey [name], [name] ([id number]) [phone].pdf Page 3 of 9 Primary Care Provider: [name] Other Clinician: Referring Provider: Treating Provider/Extender: [name] in Treatment: 0 Constitutional patient is hypertensive.. pulse regular and within target range for patient.[name] respirations regular, non-labored and within target range for patient.[name] temperature within target range for patient.. Well-nourished and well-hydrated in no acute distress. Eyes conjunctiva clear no eyelid edema noted. pupils equal round and reactive to light and accommodation. Ears, Nose, Mouth, and Throat no gross abnormality of ear auricles or external auditory canals. normal hearing noted during conversation. mucus membranes moist. Respiratory normal breathing without difficulty. Musculoskeletal Patient has a left  below-knee amputation and was using crutches to get around today.[name] Psychiatric this patient is able to make decisions and demonstrates good insight into disease process. Alert and Oriented x 3. pleasant and cooperative. Notes Upon inspection patient's wound bed actually showed signs of being a bit deeper than what we initially suspected. With that being said I do believe that he is going to require some debridement today and I discussed that with him to clearway some of the necrotic debris in the central portion of the wound this will allow for more appropriate packing. He voiced understanding. Subsequently I did perform a debridement with a #3 curette he tolerated that today without complication and post debridement this appears to be doing much better. Nonetheless he does have quite a bit of depth and undermining which was noted as well. Electronic Signature(s) Signed: 03/27/2022 1:26:02 PM By: Worthy Keeler PA-Navarro Entered By: Worthy Keeler on 03/27/2022 13:26:02 -------------------------------------------------------------------------------- Physician Orders Details Patient Name: Date of Service: Jeffrey [name] 03/27/2022 8:30 A M Medical Record Number: [id number] Patient Account Number: [id number] Date of Birth/Sex: Treating RN: Jeffrey [name][birth year] (37 y.o. [place] Meth Primary Care Provider: [name] Other Clinician: Referring Provider: Treating Provider/Extender: [name] in Treatment: 0 Verbal / Phone Orders: No Diagnosis Coding ICD-10 Coding Code Description [phone] Pressure ulcer of other site, stage 3 Z89.512 Acquired absence of left leg below knee Q05.9 Spina bifida, unspecified Follow-up Appointments Return Appointment in 1 week. Bathing/ Shower/ Hygiene May shower; gently cleanse wound with antibacterial soap, rinse and pat dry prior to dressing wounds Wound Treatment Wound #1 - Amputation Site - Below Knee Wound Laterality: Left,  Midline Cleanser: Soap and Water 1 x Per Day/30 Days ARISH, REDNER ([id number]) [id number] Page 4 of 9 Discharge Instructions: Gently cleanse wound with antibacterial soap, rinse and pat dry prior to dressing wounds Prim Dressing: Iodoform 1/4x 5 (in/yd) 1 x Per Day/30 Days ary Discharge Instructions: Apply Iodoform Packing Strip as instructed. Secondary Dressing: ABD Pad 5x9 (in/in) 1 x Per Day/30 Days Discharge Instructions: Cover with ABD pad Secondary Dressing: Conforming Guaze Roll-Medium 1 x Per Day/30 Days Discharge Instructions:  Apply Conforming Stretch Guaze Bandage as directed Secured With: [employer], Latex-free, Size 5, Small-Head / Shoulder / Thigh 1 x Per Day/30 Days Radiology X-ray, other - (ICD10 L89.893 - Pressure ulcer of other site, stage 3) Electronic Signature(s) Signed: 03/27/2022 12:28:56 PM By: Midge Aver MSN RN CNS WTA Signed: 03/27/2022 1:28:04 PM By: Lenda Kelp PA-Navarro Previous Signature: 03/27/2022 12:28:44 PM Version By: Midge Aver MSN RN CNS WTA Previous Signature: 03/27/2022 12:05:11 PM Version By: Midge Aver MSN RN CNS WTA Entered By: Midge Aver on 03/27/2022 12:28:56 -------------------------------------------------------------------------------- Problem List Details Patient Name: Date of Service: Jeffrey Savoy Navarro. 03/27/2022 8:30 A M Medical Record Number: [id number] Patient Account Number: [id number] Date of Birth/Sex: Treating RN: [birth year]rey [name] (36 y.o. Jeffrey Cluck Primary Care Provider: Vernona Rieger Other Clinician: Referring Provider: Treating Provider/Extender: Jeffrey Louis in Treatment: 0 Active Problems ICD-10 Encounter Code Description Active Date MDM Diagnosis L89.893 Pressure ulcer of other site, stage 3 03/27/2022 No Yes Z89.512 Acquired absence of left leg below knee 03/27/2022 No Yes Q05.9 Spina bifida, unspecified 03/27/2022 No Yes Inactive  Problems Resolved Problems Electronic Signature(s) Signed: 03/27/2022 9:21:02 AM By: Lenda Kelp PA-Navarro Entered By: Lenda Kelp on 03/27/2022 09:21:02 Jeffrey [name] ([id number]) [id number] Page 5 of 9 -------------------------------------------------------------------------------- Progress Note Details Patient Name: Date of Service: Jeffrey [name], Jeffrey [name] 03/27/2022 8:30 A M Medical Record Number: [id number] Patient Account Number: [id number] Date of Birth/Sex: Treating RN: Jeffrey [name], Jeffrey [name] (36 y.o. 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 He states this began somewhere around January 24, 2022. Subsequently he has been seen by his primary care provider who did refer him to [place] I believe and subsequently they placed him on antibiotics that was for doxycycline 14 days on December [name]. 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 There has not been any recent x-rays and I do believe he needs to get an x-ray before next week's follow- up appointment. Nonetheless this does actually go a little bit deeper than what I initially was hoping for and again it was obvious on the evaluation that something was not quite right. With that being said I do think that we can definitely start things as far as treatment is concerned and try to get  this under control he does seem like this was more of a pressure and abrasion type issue in regard to his prosthesis and how it fits on his leg he does have signs of some abrasion and skin changes due to this on the distal portion of his stump of the left leg which he tells me has been going on for quite some time. I think his prosthesis may not be fitting quite right. Patient has a history of spina bifida but otherwise really no other major medical problems other than what he is dealing with currently. Patient History Allergies latex, Sulfa (Sulfonamide Antibiotics) Social History Never smoker, Marital Status -  Married, Alcohol Use - Rarely, Drug Use - No History, Caffeine Use - Moderate. Medical A Surgical History Notes nd Neurologic Spina Bifida Review of Systems (ROS) Hematologic/Lymphatic Denies complaints or symptoms of Bleeding / Clotting Disorders, Human Immunodeficiency Virus. Respiratory Denies complaints or symptoms of Chronic or frequent coughs, Shortness of Breath. Cardiovascular Denies complaints or symptoms of Chest pain, LE edema. Gastrointestinal Denies complaints or symptoms of Frequent diarrhea, Nausea, Vomiting. Endocrine Denies complaints or symptoms of Hepatitis, Thyroid disease, Polydypsia (Excessive Thirst). Genitourinary Denies complaints or symptoms of Kidney failure/ Dialysis, Incontinence/dribbling. Immunological Denies complaints or symptoms of Hives, Itching. Neurologic Denies complaints or symptoms of Numbness/parasthesias, Focal/Weakness. Objective Constitutional Jeffrey [name], Jeffrey [name] ([id number]) [id number] Page 6 of 9 patient is hypertensive.. pulse regular and within target range for patient.[name] respirations regular, non-labored and within target range for patient.[name] temperature within target range for patient.. Well-nourished and well-hydrated in no acute distress. Vitals Time Taken: 8:57 AM, Temperature: 98.1 F, Pulse:  121 bpm, Respiratory Rate: 16 breaths/min, Blood Pressure: 163/102 mmHg. Eyes conjunctiva clear no eyelid edema noted. pupils equal round and reactive to light and accommodation. Ears, Nose, Mouth, and Throat no gross abnormality of ear auricles or external auditory canals. normal hearing noted during conversation. mucus membranes moist. Respiratory normal breathing without difficulty. Musculoskeletal Patient has a left below-knee amputation and was using crutches to get around today.[name] Psychiatric this patient is able to make decisions and demonstrates good insight into disease process. Alert and Oriented x 3. pleasant and cooperative. General Notes: Upon inspection patient's wound bed actually showed signs of being a bit deeper than what we initially suspected. With that being said I do believe that he is going to require some debridement today and I discussed that with him to clearway some of the necrotic debris in the central portion of the wound this will allow for more appropriate packing. He voiced understanding. Subsequently I did perform a debridement with a #3 curette he tolerated that today without complication and post debridement this appears to be doing much better. Nonetheless he does have quite a bit of depth and undermining which was noted as well. Integumentary (Hair, Skin) Wound #1 status is Open. Original cause of wound was Gradually Appeared. The date acquired was: 01/24/2022. The wound is located on the Left,Midline Amputation Site - Below Knee. The wound measures 1.7cm length x 2cm width x 1.7cm depth; 2.67cm^2 area and 4.54cm^3 volume. There is Fat Layer (Subcutaneous Tissue) exposed. There is no tunneling noted, however, there is undermining starting at 12:00 and ending at 12:00 with a maximum distance of 3cm. There is a medium amount of sanguinous drainage noted. There is large (67-100%) red, friable granulation within the wound bed. There is a small (1-33%) amount of  necrotic tissue within the wound bed. Assessment Active Problems ICD-10 Pressure ulcer of other site, stage 3 Acquired absence of left leg below knee Spina bifida, unspecified Procedures Wound #1 Pre-procedure diagnosis of Wound #1 is a Pressure Ulcer located on the Left,Midline Amputation Site - Below Knee . There was a Excisional Skin/Subcutaneous Tissue Debridement with a total area of 3.4 sq cm performed by Jeffrey Meuse., PA-Navarro. With the following instrument(s): Curette to remove Viable and Non- Viable tissue/material. Material removed includes Subcutaneous Tissue and Slough and. No specimens were taken.A Moderate amount of bleeding was controlled with Pressure. The procedure was tolerated well. Post Debridement Measurements: 1.7cm length x 2cm width x 1.9cm depth; 5.074cm^3 volume. Post debridement Stage noted as Category/Stage Navarro. Character of Wound/Ulcer  Post Debridement is stable. Post procedure Diagnosis Wound #1: Same as Pre-Procedure Plan Follow-up Appointments: Return Appointment in 1 week. Bathing/ Shower/ Hygiene: May shower; gently cleanse wound with antibacterial soap, rinse and pat dry prior to dressing wounds Radiology ordered were: X-ray, other WOUND #1: - Amputation Site - Below Knee Wound Laterality: Left, Midline Cleanser: Soap and Water 1 x Per Day/30 Days Discharge Instructions: Gently cleanse wound with antibacterial soap, rinse and pat dry prior to dressing wounds Prim Dressing: Iodoform 1/4x 5 (in/yd) 1 x Per Day/30 Days ary Discharge Instructions: Apply Iodoform Packing Strip as instructed. Secondary Dressing: ABD Pad 5x9 (in/in) 1 x Per Day/30 Days Discharge Instructions: Cover with ABD pad Secondary Dressing: Conforming Guaze Roll-Medium 1 x Per Day/30 Days Discharge Instructions: Apply Conforming Stretch Guaze Bandage as directed Secured With: Stretch Net Dressing, Latex-free, Size 5, Small-Head / Shoulder / Thigh 1 x Per Day/30 Days Jeffrey [name], Jeffrey [name] ([id number]) [id number] Page 7 of 9 1. Based on what I am seeing I do believe that the patient would benefit from packing with iodoform gauze quarter inch I think that skin to be the best way to go. 2. I am also can recommend that we have him use an ABD pad over top roll gauze to secure in place and then stretch net to hold in place. 3. I am also can recommend the patient should continue to monitor for any signs of worsening or infection such as increased pain, drainage, or in general redness to the surrounding area. If anything changes he knows contact the office and let me know. 4. I am going to send him for an x-ray as well in order to further evaluate for any signs of a bone infection or deeper structural issue. We will see patient back for reevaluation in 1 week here in the clinic. If anything worsens or changes patient will contact our office for additional recommendations. Electronic Signature(s) Signed: 03/27/2022 1:27:21 PM By: Worthy Keeler PA-Navarro Entered By: Worthy Keeler on 03/27/2022 13:27:20 -------------------------------------------------------------------------------- ROS/PFSH Details Patient Name: Date of Service: Jeffrey [name], [place]. 03/27/2022 8:30 A M Medical Record Number: [id number] Patient Account Number: [id number] Date of Birth/Sex: Treating RN: [birth year]Jeffrey [name] (37 y.o. [place] Meth Primary Care Provider: [name] Other Clinician: Referring Provider: Treating Provider/Extender: [name] in Treatment: 0 Hematologic/Lymphatic Complaints and Symptoms: Negative for: Bleeding / Clotting Disorders; Human Immunodeficiency Virus Respiratory Complaints and Symptoms: Negative for: Chronic or frequent coughs; Shortness of Breath Cardiovascular Complaints and Symptoms: Negative for: Chest pain; LE edema Gastrointestinal Complaints and Symptoms: Negative for: Frequent diarrhea; Nausea;  Vomiting Endocrine Complaints and Symptoms: Negative for: Hepatitis; Thyroid disease; Polydypsia (Excessive Thirst) Genitourinary Complaints and Symptoms: Negative for: Kidney failure/ Dialysis; Incontinence/dribbling Immunological Complaints and Symptoms: Negative for: Hives; Itching Neurologic Complaints and Symptoms: DEMONT, LINFORD ([id number]) [id number] Page 8 of 9 Negative for: Numbness/parasthesias; Focal/Weakness Medical History: Past Medical History Notes: Spina Bifida Immunizations Pneumococcal Vaccine: Received Pneumococcal Vaccination: No Implantable Devices No devices added Family and Social History Never smoker; Marital Status - Married; Alcohol Use: Rarely; Drug Use: No History; Caffeine Use: Moderate Electronic Signature(s) Signed: 03/27/2022 1:28:04 PM By: Worthy Keeler PA-Navarro Signed: 04/09/2022 4:58:20 PM By: Rosalio Loud MSN RN CNS WTA Entered By: Rosalio Loud on 03/27/2022 09:08:55 -------------------------------------------------------------------------------- SuperBill Details Patient Name: Date of Service: Jeffrey [name] 03/27/2022 Medical Record Number: [id number] Patient Account Number: [id number] Date of Birth/Sex: Treating RN: [birth year] Jeffrey [name] (37 y.o. [place] Meth Primary Care Provider: [name]  Other Clinician: Referring Provider: Treating Provider/Extender: Jeffrey Louis in Treatment: 0 Diagnosis Coding ICD-10 Codes Code Description [phone] Pressure ulcer of other site, stage 3 Z89.512 Acquired absence of left leg below knee Q05.9 Spina bifida, unspecified Facility Procedures : CPT4 Code: [id number] Description: 99213 - WOUND CARE VISIT-LEV 3 EST PT Modifier: Quantity: 1 : CPT4 Code: [id number] Description: 11042 - DEB SUBQ TISSUE 20 SQ CM/< ICD-10 Diagnosis Description L89.893 Pressure ulcer of other site, stage 3 Modifier: Quantity: 1 Physician Procedures : CPT4 Code  Description Modifier [id number] 99204 - WC PHYS LEVEL 4 - NEW PT 25 ICD-10 Diagnosis Description L89.893 Pressure ulcer of other site, stage 3 Z89.512 Acquired absence of left leg below knee Q05.9 Spina bifida, unspecified Quantity: 1 : [id number] 11042 - WC PHYS SUBQ TISS 20 SQ CM ICD-10 Diagnosis Description MOTTY, BORIN ([id number]) [id number] ICD-10 Diagnosis Description L89.893 Pressure ulcer of other site, stage 3 Quantity: 1 7.pdf Page 9 of 9 Electronic Signature(s) Signed: 03/27/2022 1:27:36 PM By: Lenda Kelp PA-Navarro Entered By: Lenda Kelp on 03/27/2022 13:27:36

## 2022-04-09 NOTE — Progress Notes (Signed)
Jeffrey, Navarro ([id number]) [id number] Page 1 of 5 Visit Report for 03/27/2022 Abuse Risk Screen Details Patient Name: Date of Service: [name], [name] [name] 03/27/2022 8:30 A M Medical Record Number: [id number] Patient Account Number: [id number] Date of Birth/Sex: Treating RN: 13-Jun-1985 (37 y.o. Sew[place] Meth Primary Care Ashiah Karpowicz: [name] Other Clinician: Referring Tamasha Laplante: Treating Desarai Barrack/Extender: [name] in Treatment: 0 Abuse Risk Screen Items Answer Electronic Signature(s) Signed: 04/09/2022 4:58:20 PM By: Rosalio Loud MSN RN CNS WTA Entered By: Rosalio Loud on 03/27/2022 09:08:59 -------------------------------------------------------------------------------- Activities of Daily Living Details Patient Name: Date of Service: Jeffrey, Navarro 03/27/2022 8:30 A M Medical Record Number: [id number] Patient Account Number: [id number] Date of Birth/Sex: Treating RN: 06-11-1985 (37 y.o. Seward Meth Primary Care Marcia Hartwell: [name] Other Clinician: Referring Pyper Olexa: Treating Hitesh Fouche/Extender: [name] in Treatment: 0 Activities of Daily Living Items Answer Activities of Daily Living (Please select one for each item) Drive Automobile Completely Able T Medications ake Completely Able Use T elephone Completely Able Care for Appearance Completely Able Use T oilet Completely Able Bath / Shower Completely Able Dress Self Completely Able Feed Self Completely Able Walk Completely Able Get In / Out Bed Completely Able Housework Completely Able Prepare Meals Completely Able Handle Money Completely Able Shop for Self Completely JOVE, BEYL ([id number]) [id number] Page 2 of 5 Electronic Signature(s) Signed: 04/09/2022 4:58:20 PM By: Rosalio Loud MSN RN CNS WTA Entered By: Rosalio Loud on 03/27/2022  09:09:28 -------------------------------------------------------------------------------- Education Screening Details Patient Name: Date of Service: [name] 03/27/2022 8:30 A M Medical Record Number: [id number] Patient Account Number: [id number] Date of Birth/Sex: Treating RN: September 02, 1985 (37 y.o. Seward Meth Primary Care Mareena Cavan: [name] Other Clinician: Referring Amaan Meyer: Treating Terriann Difonzo/Extender: [name] in Treatment: 0 Learning Preferences/Education Level/Primary Language Learning Preference: Explanation Highest Education Level: College or Above Preferred Language: English Cognitive Barrier Language Barrier: No Translator Needed: No Memory Deficit: No Emotional Barrier: No Cultural/Religious Beliefs Affecting Medical Care: No Physical Barrier Impaired Vision: No Impaired Hearing: No Decreased Hand dexterity: No Knowledge/Comprehension Knowledge Level: High Comprehension Level: High Ability to understand written instructions: High Ability to understand verbal instructions: High Motivation Anxiety Level: Calm Cooperation: Cooperative Education Importance: Acknowledges Need Interest in Health Problems: Asks Questions Perception: Coherent Willingness to Engage in Self-Management High Activities: Readiness to Engage in Self-Management High Activities: Electronic Signature(s) Signed: 04/09/2022 4:58:20 PM By: Rosalio Loud MSN RN CNS WTA Entered By: Rosalio Loud on 03/27/2022 09:10:00 Vaughan Basta ([id number]) [id number] Page 3 of 5 -------------------------------------------------------------------------------- Fall Risk Assessment Details Patient Name: Date of Service: Jeffrey, [name] 03/27/2022 8:30 A M Medical Record Number: [id number] Patient Account Number: [id number] Date of Birth/Sex: Treating RN: 1985/04/05 (37 y.o. Seward Meth Primary Care Dorisann Schwanke: [name] Other Clinician: Referring Athalia Setterlund: Treating Vi Whitesel/Extender: [name] in Treatment: 0 Fall Risk Assessment Items Have you had 2 or more falls in the last 12 monthso 0 No Have you had any fall that resulted in injury in the last 12 monthso 0 No FALLS RISK SCREEN History of falling - immediate or within 3 months 0 No Secondary diagnosis (Do you have 2 or more medical diagnoseso) 0 No Ambulatory aid None/bed rest/wheelchair/nurse 0 No Crutches/cane/walker 0 No Furniture 0 No Intravenous therapy Access/Saline/Heparin Lock 0 No Gait/Transferring Normal/ bed rest/ wheelchair 0 No Weak (short steps with or without shuffle, stooped but able to lift head  while walking, may seek 0 No support from furniture) Impaired (short steps with shuffle, may have difficulty arising from chair, head down, impaired 0 No balance) Mental Status Oriented to own ability 0 Yes Electronic Signature(s) Signed: 04/09/2022 4:58:20 PM By: Rosalio Loud MSN RN CNS WTA Entered By: Rosalio Loud on 03/27/2022 09:10:16 -------------------------------------------------------------------------------- Foot Assessment Details Patient Name: Date of Service: [name]. 03/27/2022 8:30 A M Medical Record Number: [id number] Patient Account Number: [id number] Date of Birth/Sex: Treating RN: [birth year] (37 y.o. Seward Meth Primary Care Taseen Marasigan: [name] Other Clinician: Referring Omaree Fuqua: Treating Tymeer Vaquera/Extender: [name] in Treatment: 0 Foot Assessment Items Site Locations [place], [place] C ([id number]) [phone].pdf Page 4 of 5 + = Sensation present, - = Sensation absent, C = Callus, U = Ulcer R = Redness, W = Warmth, M = Maceration, PU = Pre-ulcerative lesion F = Fissure, S = Swelling, D = Dryness Assessment Right: Left: Other Deformity: No No Prior Foot Ulcer: No No Prior Amputation: No  No Charcot Joint: No No Ambulatory Status: Ambulatory With Help Assistance Device: Crutches Gait: Steady Electronic Signature(s) Signed: 04/09/2022 4:58:20 PM By: Rosalio Loud MSN RN CNS WTA Entered By: Rosalio Loud on 03/27/2022 09:10:37 -------------------------------------------------------------------------------- Nutrition Risk Screening Details Patient Name: Date of Service: [name]. 03/27/2022 8:30 A M Medical Record Number: [id number] Patient Account Number: [id number] Date of Birth/Sex: Treating RN: Jun 14, 1985 (37 y.o. Seward Meth Primary Care Milo Schreier: [name] Other Clinician: Referring Braidyn Scorsone: Treating Nakeitha Milligan/Extender: [name] in Treatment: 0 Height (in): Weight (lbs): Body Mass Index (BMI): Nutrition Risk Screening Items Score Screening NUTRITION RISK SCREEN: I have an illness or condition that made me change the kind and/or amount of food I eat 0 No I eat fewer than two meals per day 0 No I eat few fruits and vegetables, or milk products 0 No I have three or more drinks of beer, liquor or wine almost every day 0 No I have tooth or mouth problems that make it hard for me to eat 0 No Salts, Adi C ([id number]) [id number] Page 5 of 5 I don't always have enough money to buy the food I need 0 No I eat alone most of the time 0 No I take three or more different prescribed or over-the-counter drugs a day 0 No Without wanting to, I have lost or gained 10 pounds in the last six months 0 No I am not always physically able to shop, cook and/or feed myself 0 No Nutrition Protocols Good Risk Protocol 0 No interventions needed Moderate Risk Protocol High Risk Proctocol Risk Level: Good Risk Score: 0 Electronic Signature(s) Signed: 04/09/2022 4:58:20 PM By: Rosalio Loud MSN RN CNS WTA Entered By: Rosalio Loud on 03/27/2022 09:10:23

## 2022-04-13 ENCOUNTER — Encounter: Payer: BC Managed Care – PPO | Admitting: Physician Assistant

## 2022-04-13 DIAGNOSIS — L89893 Pressure ulcer of other site, stage 3: Secondary | ICD-10-CM | POA: Diagnosis not present

## 2022-04-13 NOTE — Progress Notes (Signed)
JAYVEION, STALLING ([id number]) [id number] Page 1 of 6 Visit Report for 04/13/2022 Chief Complaint Document Details Patient Name: Date of Service: [name], [place] 04/13/2022 4:15 PM Medical Record Number: [id number] Patient Account Number: [id number] Date of Birth/Sex: Treating RN: 18-Dec-1985 (37 y.o. Arthur Holms Primary Care Provider: Vernona Rieger Other Clinician: Betha Loa Referring Provider: Treating Provider/Extender: Robbie Louis in Treatment: 2 Information Obtained from: Patient Chief Complaint Pressure ulcer left BKA location Electronic Signature(s) Signed: 04/13/2022 4:58:02 PM By: Lenda Kelp PA-C Entered By: Lenda Kelp on 04/13/2022 16:58:02 -------------------------------------------------------------------------------- HPI Details Patient Name: Date of Service: [name] [place] 04/13/2022 4:15 PM Medical Record Number: [id number] Patient Account Number: [id number] Date of Birth/Sex: Treating RN: [birth year] (37 y.o. 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 He states this began somewhere around January 24, 2022. Subsequently he has been seen by his primary care provider who did refer him to [place] I believe and subsequently they placed him on antibiotics that was for doxycycline 14 days on December 29. I did review his culture results from that time as well and it showed that he did have MRSA and the doxycycline was an appropriate treatment for that so he obviously seems to be doing better at this point which is  great news. There has not been any recent x-rays and I do believe he needs to get an x-ray before next week's follow-up appointment. Nonetheless this does actually go a little bit deeper than what I initially was hoping for and again it was obvious on the evaluation that something was not quite right. With that being said I do think that we can definitely start things as far as treatment is concerned and try to get this under control he does seem like this was more of a pressure and abrasion type issue in regard to his prosthesis and how it fits on his leg he does have signs of some abrasion and skin changes due to this on the distal portion of his stump of the left leg which he tells me has been going on for quite some time. I think his prosthesis may not be fitting quite right. Patient has a history of spina bifida but otherwise really no other major medical problems other than what he is dealing with currently. 04-06-2022 upon evaluation today patient appears to be doing well with regard to his wound. He is actually making some signs of good improvement which is great news and overall I am extremely pleased with where we stand today. There does not appear to be any evidence of active infection locally nor systemically at this time which is great news. No fevers, chills, nausea, vomiting, or diarrhea. DONI, WIDMER ([id number]) [id number] Page 2 of 6 04-13-2022 upon evaluation today patient appears to be doing well currently in regard to his wound. In fact it appears that this wound is significantly improved even compared to last time I saw him. I am very pleased in that regard I do believe that we should see about changing things up I do not believe the [facility] Blue rope is going to continue to benefit him going forward but this  is simply due to the fact honestly that I believe it is going to keep it more open as opposed to allow it to continue to close and. In  general I do think that we are headed in the right direction which is great news. No fevers, chills, nausea, vomiting, or diarrhea. Electronic Signature(s) Signed: 04/13/2022 5:32:09 PM By: Worthy Keeler PA-C Entered By: Worthy Keeler on 04/13/2022 17:32:09 -------------------------------------------------------------------------------- Physical Exam Details Patient Name: Date of Service: [name], [name] 04/13/2022 4:15 PM Medical Record Number: [id number] Patient Account Number: [id number] Date of Birth/Sex: Treating RN: 08-Dec-1985 (37 y.o. Verl Blalock Primary Care Provider: [name] Other Clinician: Massie Kluver Referring Provider: Treating Provider/Extender: [name] in Treatment: 2 Constitutional Well-nourished and well-hydrated in no acute distress. Respiratory normal breathing without difficulty. Psychiatric this patient is able to make decisions and demonstrates good insight into disease process. Alert and Oriented x 3. pleasant and cooperative. Notes Upon inspection patient's wound bed does still have some depth but this is doing much better I do not think the [employer]fera Blue rope is going to benefit and going forward I think we need something a little bit less compacted that will allow for new epithelial growth. I discussed that with him as well today. Electronic Signature(s) Signed: 04/13/2022 5:51:01 PM By: Worthy Keeler PA-C Entered By: Worthy Keeler on 04/13/2022 17:51:01 -------------------------------------------------------------------------------- Physician Orders Details Patient Name: Date of Service: [name] 04/13/2022 4:15 PM Medical Record Number: [id number] Patient Account Number: [id number] Date of Birth/Sex: Treating RN: 1985/05/05 (37 y.o. Verl Blalock Primary Care Provider: [name] Other Clinician: Massie Kluver Referring Provider: Treating Provider/Extender: [name] in Treatment: 2 Verbal / Phone Orders: No Diagnosis Coding DMARI, SCHUBRING ([id number]) [id number] Page 3 of 6 ICD-10 Coding Code Description [phone] Pressure ulcer of other site, stage 3 Z89.512 Acquired absence of left leg below knee Q05.9 Spina bifida, unspecified Follow-up Appointments Return Appointment in 1 week. Bathing/ Shower/ Hygiene May shower; gently cleanse wound with antibacterial soap, rinse and pat dry prior to dressing wounds Wound Treatment Wound #1 - Amputation Site - Below Knee Wound Laterality: Left, Midline Cleanser: Soap and Water 1 x Per Day/30 Days Discharge Instructions: Gently cleanse wound with antibacterial soap, rinse and pat dry prior to dressing wounds Prim Dressing: Prisma 4.34 (in) 1 x Per Day/30 Days ary Discharge Instructions: pack into wound dry then moisten with saline Secondary Dressing: (BORDER) Zetuvit Plus SILICONE BORDER Dressing 4x4 (in/in) 1 x Per Day/30 Days Discharge Instructions: Please do not put silicone bordered dressings under wraps. Use non-bordered dressing only. Electronic Signature(s) Signed: 04/13/2022 8:05:55 PM By: Worthy Keeler PA-C Signed: 04/14/2022 4:19:16 PM By: Massie Kluver Entered By: Massie Kluver on 04/13/2022 17:05:22 -------------------------------------------------------------------------------- Problem List Details Patient Name: Date of Service: [name] 04/13/2022 4:15 PM Medical Record Number: [id number] Patient Account Number: [id number] Date of Birth/Sex: Treating RN: [birth year] (37 y.o. Verl Blalock Primary Care Provider: [name] Other Clinician: Massie Kluver Referring Provider: Treating Provider/Extender: [name] in Treatment: 2 Active Problems ICD-10 Encounter Code Description Active Date MDM Diagnosis [phone] Pressure ulcer of other site, stage 3 03/27/2022 No Yes Z89.512 Acquired absence of left  leg below knee 03/27/2022 No Yes Q05.9 Spina bifida, unspecified 03/27/2022 No Yes Inactive Problems Cudworth, Davaughn C ([id number]) [id number] Page 4 of 6 Resolved Problems Electronic Signature(s) Signed: 04/13/2022 4:57:50 PM By: Worthy Keeler PA-C Entered By:  Worthy Keeler on 04/13/2022 16:57:49 -------------------------------------------------------------------------------- Progress Note Details Patient Name: Date of Service: [name], [name] [name] 04/13/2022 4:15 PM Medical Record Number: [id number] Patient Account Number: [id number] Date of Birth/Sex: Treating RN: Jan 04, 1986 (37 y.o. 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 He states this began somewhere around January 24, 2022. Subsequently he has been seen by his primary care provider who did refer him to [place] I believe and subsequently they placed him on antibiotics that was for doxycycline 14 days on December 29. 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 There has not been any recent x-rays and I do believe he needs to get an x-ray before next week's follow- up appointment. Nonetheless this does actually go a little bit deeper than what I initially was hoping for and again it was obvious on the evaluation that something was not quite right. With that  being said I do think that we can definitely start things as far as treatment is concerned and try to get this under control he does seem like this was more of a pressure and abrasion type issue in regard to his prosthesis and how it fits on his leg he does have signs of some abrasion and skin changes due to this on the distal portion of his stump of the left leg which he tells me has been going on for quite some time. I think his prosthesis may not be fitting quite right. Patient has a history of spina bifida but otherwise really no other major medical problems other than what he is dealing with currently. 04-06-2022 upon evaluation today patient appears to be doing well with regard to his wound. He is actually making some signs of good improvement which is great news and overall I am extremely pleased with where we stand today. There does not appear to be any evidence of active infection locally nor systemically at this time which is great news. No fevers, chills, nausea, vomiting, or diarrhea. 04-13-2022 upon evaluation today patient appears to be doing well currently in regard to his wound. In fact it appears that this wound is significantly improved even compared to last time I saw him. I am very pleased in that regard I do believe that we should see about changing things up I do not believe the [facility] Blue rope is going to continue to benefit him going forward but this is simply due to the fact honestly that I believe it is going to keep it more open as opposed to allow it to continue to close and. In general I do think that we are headed in the right direction which is great news. No fevers, chills, nausea, vomiting, or diarrhea. Objective Constitutional Well-nourished and well-hydrated in no acute distress. Vitals Time Taken: 4:44 PM, Temperature: 98.1 F, Pulse: 137 bpm, Respiratory Rate: 16 breaths/min, Blood Pressure: 170/114 mmHg. General Notes: Patient has white coat  syndrome Respiratory  normal breathing without difficulty. Psychiatric this patient is able to make decisions and demonstrates good insight into disease process. Alert and Oriented x 3. pleasant and cooperative. TORIE, PRIEBE ([id number]) [id number] Page 5 of 6 General Notes: Upon inspection patient's wound bed does still have some depth but this is doing much better I do not think the Hydrofera Blue rope is going to benefit and going forward I think we need something a little bit less compacted that will allow for new epithelial growth. I discussed that with him as well today. Integumentary (Hair, Skin) Wound #1 status is Open. Original cause of wound was Gradually Appeared. The date acquired was: 01/24/2022. The wound has been in treatment 2 weeks. The wound is located on the Left,Midline Amputation Site - Below Knee. The wound measures 0.4cm length x 0.5cm width x 0.5cm depth; 0.157cm^2 area and 0.079cm^3 volume. There is Fat Layer (Subcutaneous Tissue) exposed. There is tunneling at 12:00 with a maximum distance of 1.8cm. There is a medium amount of sanguinous drainage noted. There is large (67-100%) red, friable granulation within the wound bed. There is a small (1-33%) amount of necrotic tissue within the wound bed. Assessment Active Problems ICD-10 Pressure ulcer of other site, stage 3 Acquired absence of left leg below knee Spina bifida, unspecified Plan Follow-up Appointments: Return Appointment in 1 week. Bathing/ Shower/ Hygiene: May shower; gently cleanse wound with antibacterial soap, rinse and pat dry prior to dressing wounds WOUND #1: - Amputation Site - Below Knee Wound Laterality: Left, Midline Cleanser: Soap and Water 1 x Per Day/30 Days Discharge Instructions: Gently cleanse wound with antibacterial soap, rinse and pat dry prior to dressing wounds Prim Dressing: Prisma 4.34 (in) 1 x Per Day/30 Days ary Discharge Instructions: pack into  wound dry then moisten with saline Secondary Dressing: (BORDER) Zetuvit Plus SILICONE BORDER Dressing 4x4 (in/in) 1 x Per Day/30 Days Discharge Instructions: Please do not put silicone bordered dressings under wraps. Use non-bordered dressing only. 1. Based on what I am seeing I am going to go ahead and recommend that we have the patient continue to monitor for any signs of infection or worsening. Specifically I would recommend that we switched over to a silver collagen dressing which I think is an be a much better way to go. 2. Also can recommend a border foam as opposed to a roll gauze dressing to secure in place. 3. I would also suggest that he continue to keep pressure off of this area though it should not be too hard is not wearing his prosthesis at this point. 4. I did recommend that he may want to contact [facility] where he had the prosthesis done to let them know that he is going to need some adjustments in regard to this so they can see whether they need to go ahead and schedule this or do something else. The patient voiced understanding and is in agreement with the plan. We will see patient back for reevaluation in 1 week here in the clinic. If anything worsens or changes patient will contact our office for additional recommendations. Electronic Signature(s) Signed: 04/13/2022 5:51:59 PM By: Lenda Kelp PA-C Entered By: Lenda Kelp on 04/13/2022 17:51:58 -------------------------------------------------------------------------------- SuperBill Details Patient Name: Date of Service: [name] [place] 04/13/2022 Medical Record Number: [id number] Patient Account Number: [id number] Date of Birth/Sex: Treating RN: 02/18/1986 (36 y.o. Arthur Holms Primary Care Provider: Vernona Rieger Other Clinician: Betha Loa Referring Provider: Treating Provider/Extender: Robbie Louis in  Treatment: 2 JARAY, BOLIVER ([id number])  [id number] Page 6 of 6 Diagnosis Coding ICD-10 Codes Code Description [phone] Pressure ulcer of other site, stage 3 Z89.512 Acquired absence of left leg below knee Q05.9 Spina bifida, unspecified Facility Procedures : CPT4 Code: [id number] Description: 99213 - WOUND CARE VISIT-LEV 3 EST PT Modifier: Quantity: 1 Physician Procedures : CPT4 Code Description Modifier [id number] 99213 - WC PHYS LEVEL 3 - EST PT ICD-10 Diagnosis Description L89.893 Pressure ulcer of other site, stage 3 Z89.512 Acquired absence of left leg below knee Q05.9 Spina bifida, unspecified Quantity: 1 Electronic Signature(s) Signed: 04/13/2022 5:52:59 PM By: Lenda Kelp PA-C Entered By: Lenda Kelp on 04/13/2022 17:52:58

## 2022-04-16 NOTE — Progress Notes (Signed)
[name] ([id number]) [id number] Page 1 of 10 Visit Report for 04/13/2022 Arrival Information Details Patient Name: Date of Service: [name], [name] [name] 04/13/2022 4:15 PM Medical Record Number: [id number] Patient Account Number: [id number] Date of Birth/Sex: Treating RN: 06-26-1985 (37 y.o. [name], [name] Primary Care [name]: [name] Other Clinician: Massie Navarro Referring [name]: Treating [name]/Extender: [name] in Treatment: 2 Visit Information History Since Last Visit All ordered tests and consults were completed: No Patient Arrived: Crutches Added or deleted any medications: No Arrival Time: 16:40 Any new allergies or adverse reactions: No Transfer Assistance: None Had a fall or experienced change in No Patient Requires Transmission-Based Precautions: No activities of daily living that may affect Patient Has Alerts: No risk of falls: Signs or symptoms of abuse/neglect since last visito No Hospitalized since last visit: No Implantable device outside of the clinic excluding No cellular tissue based products placed in the center since last visit: Has Dressing in Place as Prescribed: Yes Pain Present Now: No Electronic Signature(s) Signed: 04/14/2022 4:19:16 PM By: [name] Entered By: [name] on 04/13/2022 16:44:10 -------------------------------------------------------------------------------- Clinic Level of Care Assessment Details Patient Name: Date of Service: [name] 04/13/2022 4:15 PM Medical Record Number: [id number] Patient Account Number: [id number] Date of Birth/Sex: Treating RN: [birth year] (37 y.o. [name] Primary Care [name]: [name] Other Clinician: Massie Navarro Referring [name]: Treating [name]/Extender: [name] in Treatment: 2 Clinic Level of Care Assessment Items TOOL 4 Quantity Score []  - 0 Use when  only an EandM is performed on FOLLOW-UP visit ASSESSMENTS - Nursing Assessment / Reassessment X- 1 10 Reassessment of Co-morbidities (includes updates in patient status) X- 1 5 Reassessment of Adherence to Treatment Plan [name], [name] ([id number]) [phone].pdf Page 2 of 10 ASSESSMENTS - Wound and Skin A ssessment / Reassessment X - Simple Wound Assessment / Reassessment - one wound 1 5 []  - 0 Complex Wound Assessment / Reassessment - multiple wounds []  - 0 Dermatologic / Skin Assessment (not related to wound area) ASSESSMENTS - Focused Assessment []  - 0 Circumferential Edema Measurements - multi extremities []  - 0 Nutritional Assessment / Counseling / Intervention []  - 0 Lower Extremity Assessment (monofilament, tuning fork, pulses) []  - 0 Peripheral Arterial Disease Assessment (using hand held doppler) ASSESSMENTS - Ostomy and/or Continence Assessment and Care []  - 0 Incontinence Assessment and Management []  - 0 Ostomy Care Assessment and Management (repouching, etc.) PROCESS - Coordination of Care X - Simple Patient / Family Education for ongoing care 1 15 []  - 0 Complex (extensive) Patient / Family Education for ongoing care []  - 0 Staff obtains Programmer, systems, Records, T Results / Process Orders est []  - 0 Staff telephones HHA, Nursing Homes / Clarify orders / etc []  - 0 Routine Transfer to another Facility (non-emergent condition) []  - 0 Routine Hospital Admission (non-emergent condition) []  - 0 New Admissions / Biomedical engineer / Ordering NPWT Apligraf, etc. , []  - 0 Emergency Hospital Admission (emergent condition) X- 1 10 Simple Discharge Coordination []  - 0 Complex (extensive) Discharge Coordination PROCESS - Special Needs []  - 0 Pediatric / Minor Patient Management []  - 0 Isolation Patient Management []  - 0 Hearing / Language / Visual special needs []  - 0 Assessment of Community assistance (transportation, D/Navarro planning,  etc.) []  - 0 Additional assistance / Altered mentation []  - 0 Support Surface(s) Assessment (bed, cushion, seat, etc.) INTERVENTIONS - Wound Cleansing / Measurement X - Simple Wound Cleansing - one  wound 1 5 []  - 0 Complex Wound Cleansing - multiple wounds X- 1 5 Wound Imaging (photographs - any number of wounds) []  - 0 Wound Tracing (instead of photographs) X- 1 5 Simple Wound Measurement - one wound []  - 0 Complex Wound Measurement - multiple wounds INTERVENTIONS - Wound Dressings []  - 0 Small Wound Dressing one or multiple wounds X- 1 15 Medium Wound Dressing one or multiple wounds []  - 0 Large Wound Dressing one or multiple wounds []  - 0 Application of Medications - topical []  - 0 Application of Medications - injection INTERVENTIONS - Miscellaneous []  - 0 External ear exam [name], [name] (  .pdf Page 3 of 10 []  - 0 Specimen Collection (cultures, biopsies, blood, body fluids, etc.) []  - 0 Specimen(s) / Culture(s) sent or taken to Lab for analysis []  - 0 Patient Transfer (multiple staff / Lift / Similar devices) []  - 0 Simple Staple / Suture removal (25 or less) []  - 0 Complex Staple / Suture removal (26 or more) []  - 0 Hypo / Hyperglycemic Management (close monitor of Blood Glucose) []  - 0 Ankle / Brachial Index (ABI) - do not check if billed separately X- 1 5 Vital Signs Has the patient been seen at the hospital within the last three years: Yes Total Score: 80 Level Of Care: New/Established - Level 3 Electronic Signature(s) Signed: 04/14/2022 4:19:16 PM By: Entered By: on 04/13/2022 17:06:02 -------------------------------------------------------------------------------- Encounter Discharge Information Details Patient Name: Date of Service: [name] 04/13/2022 4:15 PM Medical Record Number: ) [id number] Patient Account Number: Date of Birth/Sex: Treating  RN: March 28, 1985 (36 y.o. , Michiel Sites Primary Care Lavance Beazer: Other Clinician: Referring Aaisha Sliter: Treating [name]/Extender: in Treatment: 2 Encounter Discharge Information Items Discharge Condition: Stable Ambulatory Status: Crutches Discharge Destination: Home Transportation: Private Auto Accompanied By: self Schedule Follow-up Appointment: Yes Clinical Summary of Care: Electronic Signature(s) Signed: 04/14/2022 4:19:16 PM By: 04/16/2022 Entered By: Betha Loa on 04/13/2022 17:15:06 Lower Extremity Assessment Details -------------------------------------------------------------------------------- 04/15/2022 (Jeffrey Genre1/31/2024.pdf Page 4 of 10 Patient Name: Date of Service: [name], [name] [id number] 04/13/2022 4:15 PM Medical Record Number: Loel Lofty Patient Account Number: Selena Batten Date of Birth/Sex: Treating RN: April 07, 1985 (36 y.o. Betha Loa Primary Care Jerome Viglione: Robbie Louis Other Clinician: 04/16/2022 Referring Zakkery Dorian: Treating Shalonda Sachse/Extender: Betha Loa in Treatment: 2 Electronic Signature(s) Signed: 04/14/2022 4:19:16 PM By: 04/15/2022 Signed: 04/15/2022 7:35:08 PM By: [id number] BSN, RN, CWS, Kim RN, BSN Entered By: ) [id number] on 04/13/2022 16:54:29 -------------------------------------------------------------------------------- Multi Wound Chart Details Patient Name: Date of Service: [name] 04/13/2022 4:15 PM Medical Record Number: [id number] Patient Account Number: [id number] Date of Birth/Sex: Treating RN: Apr 07, 1985 (36 y.o. Arthur Holms Primary Care Viggo Perko: Vernona Rieger Other Clinician: Betha Loa Referring Diara Chaudhari: Treating Shanti Eichel/Extender: Robbie Louis in Treatment: 2 Vital Signs Height(in): Pulse(bpm): 137 Weight(lbs): Blood Pressure(mmHg): 170/114 Body  Mass Index(BMI): Temperature(F): 98.1 Respiratory Rate(breaths/min): 16 [1:Photos:] [N/A:N/A] Left, Midline Amputation Site - Below N/A N/A Wound Location: Knee Gradually Appeared N/A N/A Wounding Event: Pressure Ulcer N/A N/A Primary Etiology: 01/24/2022 N/A N/A Date Acquired: 2 N/A N/A Weeks of Treatment: Open N/A N/A Wound Status: No N/A N/A Wound Recurrence: 0.4x0.5x0.5 N/A N/A Measurements L x W x D (cm) 0.157 N/A N/A A (cm) : rea 0.079 N/A N/A Volume (cm) : 94.10% N/A N/A % Reduction in A rea: 98.30% N/A N/A % Reduction  in Volume: 12 Position 1 (o'clock): 1.8 Maximum Distance 1 (cm): Yes N/A N/A Tunneling: Category/Stage III N/A N/A Classification: Medium N/A N/A Exudate A mount: Sanguinous N/A N/A Exudate Type: red N/A N/A Exudate Color: Large (67-100%) N/A N/A Granulation A mount: Red, Friable N/A N/A Granulation Quality: Small (1-33%) N/A N/A Necrotic A mount: Fat Layer (Subcutaneous Tissue): Yes N/A N/A Exposed Structures: JAHAAN, VANWAGNER ([id number]) [id number].pdf Page 5 of 10 Fascia: No Tendon: No Muscle: No Joint: No Bone: No Medium (34-66%) N/A N/A Epithelialization: Treatment Notes Electronic Signature(s) Signed: 04/14/2022 4:19:16 PM By: [name] Entered By: [name] on 04/13/2022 16:54:38 -------------------------------------------------------------------------------- [place] Details Patient Name: Date of Service: [name] 04/13/2022 4:15 PM Medical Record Number: [id number] Patient Account Number: [id number] Date of Birth/Sex: Treating RN: 1985-06-01 (37 y.o. [name], [name] Primary Care Jorey Dollard: [name] Other Clinician: Massie Navarro Referring Primrose Oler: Treating Zymier Rodgers/Extender: [name] in Treatment: 2 Active Inactive Necrotic Tissue Nursing Diagnoses: Impaired tissue integrity related to necrotic/devitalized  tissue Knowledge deficit related to management of necrotic/devitalized tissue Goals: Necrotic/devitalized tissue will be minimized in the wound bed Date Initiated: 03/27/2022 Target Resolution Date: 04/27/2022 Goal Status: Active Patient/caregiver will verbalize understanding of reason and process for debridement of necrotic tissue Date Initiated: 03/27/2022 Target Resolution Date: 04/27/2022 Goal Status: Active Interventions: Assess patient pain level pre-, during and post procedure and prior to discharge Provide education on necrotic tissue and debridement process Treatment Activities: Apply topical anesthetic as ordered : 03/27/2022 Excisional debridement : 03/27/2022 T ordered outside of clinic : 03/27/2022 est Notes: Orientation to the Wound Care Program Nursing Diagnoses: Knowledge deficit related to the wound healing center program Goals: Patient/caregiver will verbalize understanding of the [place] Date Initiated: 03/27/2022 Target Resolution Date: 04/25/2022 Goal Status: Active Interventions: NENO, HOHENSEE ([id number]) [phone].pdf Page 6 of 10 Provide education on orientation to the wound center Notes: Pressure Nursing Diagnoses: Knowledge deficit related to causes and risk factors for pressure ulcer development Knowledge deficit related to management of pressures ulcers Goals: Patient will remain free from development of additional pressure ulcers Date Initiated: 03/27/2022 Target Resolution Date: 04/27/2022 Goal Status: Active Patient will remain free of pressure ulcers Date Initiated: 03/27/2022 Target Resolution Date: 04/27/2022 Goal Status: Active Patient/caregiver will verbalize risk factors for pressure ulcer development Date Initiated: 03/27/2022 Target Resolution Date: 04/27/2022 Goal Status: Active Patient/caregiver will verbalize understanding of pressure ulcer management Date Initiated: 03/27/2022 Target  Resolution Date: 04/27/2022 Goal Status: Active Interventions: Assess: immobility, friction, shearing, incontinence upon admission and as needed Assess offloading mechanisms upon admission and as needed Assess potential for pressure ulcer upon admission and as needed Provide education on pressure ulcers Treatment Activities: T ordered outside of clinic : 03/27/2022 est Notes: Wound/Skin Impairment Nursing Diagnoses: Impaired tissue integrity Knowledge deficit related to smoking impact on wound healing Knowledge deficit related to ulceration/compromised skin integrity Goals: Patient will have a decrease in wound volume by X% from date: (specify in notes) Date Initiated: 03/27/2022 Target Resolution Date: 04/27/2022 Goal Status: Active Patient/caregiver will verbalize understanding of skin care regimen Date Initiated: 03/27/2022 Target Resolution Date: 04/27/2022 Goal Status: Active Ulcer/skin breakdown will have a volume reduction of 30% by week 4 Date Initiated: 03/27/2022 Target Resolution Date: 04/27/2022 Goal Status: Active Ulcer/skin breakdown will have a volume reduction of 50% by week 8 Date Initiated: 03/27/2022 Target Resolution Date: 05/26/2022 Goal Status: Active Ulcer/skin breakdown will have a volume reduction of 80% by week 12 Date Initiated: 03/27/2022  Target Resolution Date: 06/26/2022 Goal Status: Active Ulcer/skin breakdown will heal within 14 weeks Date Initiated: 03/27/2022 Target Resolution Date: 07/10/2022 Goal Status: Active Interventions: Assess patient/caregiver ability to obtain necessary supplies Assess patient/caregiver ability to perform ulcer/skin care regimen upon admission and as needed Assess ulceration(s) every visit Provide education on ulcer and skin care Treatment Activities: Skin care regimen initiated : 03/27/2022 Notes: Electronic Signature(s) Signed: 04/14/2022 4:19:16 PM By: [name] Signed: 04/15/2022 7:35:08 PM By: Gretta Cool, BSN, RN,  CWS, Kim RN, BSN Shearman,Signed: 04/15/2022 7:35:08 PM By: Gretta Cool, BSN, RN, CWS, Kim RN, BSN [name] ([id number]) [id number] Page 7 of 10 Entered By: [name] on 04/13/2022 17:14:33 -------------------------------------------------------------------------------- Pain Assessment Details Patient Name: Date of Service: [name], [name] 04/13/2022 4:15 PM Medical Record Number: [id number] Patient Account Number: [id number] Date of Birth/Sex: Treating RN: [birth year] (37 y.o. [name] Primary Care Aala Ransom: [name] Other Clinician: Massie Navarro Referring Kilee Hedding: Treating Kymorah Korf/Extender: [name] in Treatment: 2 Active Problems Location of Pain Severity and Description of Pain Patient Has Paino No Site Locations Pain Management and Medication Current Pain Management: Electronic Signature(s) Signed: 04/14/2022 4:19:16 PM By: [name] Signed: 04/15/2022 7:35:08 PM By: Gretta Cool, BSN, RN, CWS, Kim RN, BSN Entered By: [name] on 04/13/2022 16:48:52 -------------------------------------------------------------------------------- Patient/Caregiver Education Details Patient Name: Date of Service: [name] 1/29/2024andnbsp4:15 PM Medical Record Number: [id number] Patient Account Number: [id number] TAJAY, MUZZY ([id number]) [id number] Page 8 of 10 Date of Birth/Gender: Treating RN: October 04, 1985 (37 y.o. [name] Primary Care Physician: [name] Other Clinician: Massie Navarro Referring Physician: Treating Physician/Extender: [name] in Treatment: 2 Education Assessment Education Provided To: Patient Education Topics Provided Wound/Skin Impairment: Handouts: Other: continue wound care as directed Methods: Explain/Verbal Responses: State content correctly Electronic Signature(s) Signed: 04/14/2022 4:19:16 PM By: [name] Entered By: [name] on 04/13/2022 17:14:28 -------------------------------------------------------------------------------- Wound Assessment Details Patient Name: Date of Service: Chretien, Jeffrey TTHEW Navarro. 04/13/2022 4:15 PM Medical Record Number: [id number] Patient Account Number: [id number] Date of Birth/Sex: Treating RN: 10/03/1985 (37 y.o. [name] Primary Care Mckinzy Fuller: [name] Other Clinician: Massie Navarro Referring Dimitry Holsworth: Treating Maison Kestenbaum/Extender: [name] in Treatment: 2 Wound Status Wound Number: 1 Primary Etiology: Pressure Ulcer Wound Location: Left, Midline Amputation Site - Below Knee Wound Status: Open Wounding Event: Gradually Appeared Date Acquired: 01/24/2022 Weeks Of Treatment: 2 Clustered Wound: No Photos Wound Measurements Length: (cm) 0 Width: (cm) 0 Depth: (cm) 0 Area: (cm) Volume: (cm) [name], [name] ([id number]) .4 % Reduction in Area: 94.1% .5 % Reduction in Volume: 98.3% .5 Epithelialization: Medium (34-66%) 0.157 Tunneling: Yes 0.079 Position (o'clock): 12 Maximum Distance: (cm) 1.8 [id number] Page 9 of 10 Wound Description Classification: Category/Stage III Exudate Amount: Medium Exudate Type: Sanguinous Exudate Color: red Foul Odor After Cleansing: No Slough/Fibrino No Wound Bed Granulation Amount: Large (67-100%) Exposed Structure Granulation Quality: Red, Friable Fascia Exposed: No Necrotic Amount: Small (1-33%) Fat Layer (Subcutaneous Tissue) Exposed: Yes Tendon Exposed: No Muscle Exposed: No Joint Exposed: No Bone Exposed: No Treatment Notes Wound #1 (Amputation Site - Below Knee) Wound Laterality: Left, Midline Cleanser Soap and Water Discharge Instruction: Gently cleanse wound with antibacterial soap, rinse and pat dry prior to dressing wounds Peri-Wound Care Topical Primary Dressing Prisma 4.34 (in) Discharge Instruction: pack into  wound dry then moisten with saline Secondary Dressing (BORDER) Zetuvit Plus SILICONE BORDER Dressing 4x4 (in/in) Discharge Instruction: Please do not put silicone bordered dressings under wraps.  Use non-bordered dressing only. Secured With Compression Wrap Compression Stockings Facilities manager) Signed: 04/14/2022 4:19:16 PM By: Betha Loa Signed: 04/15/2022 7:35:08 PM By: Elliot Gurney, BSN, RN, CWS, Kim RN, BSN Entered By: Betha Loa on 04/13/2022 16:53:34 -------------------------------------------------------------------------------- Vitals Details Patient Name: Date of Service: [name] 04/13/2022 4:15 PM Medical Record Number: [id number] Patient Account Number: [id number] Date of Birth/Sex: Treating RN: 1986/01/03 (36 y.o. Arthur Holms Primary Care Sunny Aguon: Vernona Rieger Other Clinician: Betha Loa Referring Oriyah Lamphear: Treating Baylee Campus/Extender: Robbie Louis in Treatment: 2 Vital Signs Time Taken: 16:44 Temperature (F): 98.1 [name], [name] ([id number]) [id number].pdf Page 10 of 10 Pulse (bpm): 137 Respiratory Rate (breaths/min): 16 Blood Pressure (mmHg): 170/114 Reference Range: 80 - 120 mg / dl Notes Patient has white coat syndrome Electronic Signature(s) Signed: 04/14/2022 4:19:16 PM By: Betha Loa Entered By: Betha Loa on 04/13/2022 16:48:46

## 2022-04-20 ENCOUNTER — Encounter: Payer: BC Managed Care – PPO | Attending: Physician Assistant | Admitting: Physician Assistant

## 2022-04-20 DIAGNOSIS — Q059 Spina bifida, unspecified: Secondary | ICD-10-CM | POA: Diagnosis not present

## 2022-04-20 DIAGNOSIS — Z89512 Acquired absence of left leg below knee: Secondary | ICD-10-CM | POA: Diagnosis not present

## 2022-04-20 DIAGNOSIS — L89893 Pressure ulcer of other site, stage 3: Secondary | ICD-10-CM | POA: Diagnosis present

## 2022-04-20 NOTE — Progress Notes (Addendum)
[name], [name] ([id number]) [id number] Page 1 of 7 Visit Report for 04/20/2022 Chief Complaint Document Details Patient Name: Date of Service: Zo, Dinovo [name] 04/20/2022 3:00 PM Medical Record Number: [id number] Patient Account Number: [id number] Date of Birth/Sex: Treating RN: Mar 22, 1985 (37 y.o. [name]) [name] Primary Care Provider: [name] Other Clinician: Referring Provider: Treating Provider/Extender: [name] in Treatment: 3 Information Obtained from: Patient Chief Complaint Pressure ulcer left BKA location Electronic Signature(s) Signed: 04/20/2022 3:28:31 PM By: Worthy Keeler PA-C Entered By: Worthy Keeler on 04/20/2022 15:28:30 -------------------------------------------------------------------------------- Debridement Details Patient Name: Date of Service: [name], [name] 04/20/2022 3:00 PM Medical Record Number: [id number] Patient Account Number: [id number] Date of Birth/Sex: Treating RN: 07-Sep-1985 (37 y.o. [name] Primary Care Provider: [name] Other Clinician: Referring Provider: Treating Provider/Extender: [name] in Treatment: 3 Debridement Performed for Assessment: Wound #1 Left,Midline Amputation Site - Below Knee Performed By: Physician Tommie Sams., PA-C Debridement Type: Debridement Level of Consciousness (Pre-procedure): Awake and Alert Pre-procedure Verification/Time Out Yes - 15:30 Taken: Start Time: 15:30 Pain Control: Lidocaine 4% T opical Solution T Area Debrided (L x W): otal 0.5 (cm) x 1 (cm) = 0.5 (cm) Tissue and other material debrided: Viable, Non-Viable, Slough, Subcutaneous, Skin: Dermis , Skin: Epidermis, Slough Level: Skin/Subcutaneous Tissue Debridement Description: Excisional Instrument: Curette Bleeding: Minimum Hemostasis Achieved: Pressure End Time: 15:34 Procedural Pain: 0 Post Procedural Pain: 0 Response to  Treatment: Procedure was tolerated well Hoeger, Mateo C ([id number]) [id number] Page 2 of 7 Level of Consciousness (Post- Awake and Alert procedure): Post Debridement Measurements of Total Wound Length: (cm) 0.5 Stage: Category/Stage III Width: (cm) 1 Depth: (cm) 0.9 Volume: (cm) 0.353 Character of Wound/Ulcer Post Debridement: Improved Post Procedure Diagnosis Same as Pre-procedure Electronic Signature(s) Signed: 04/20/2022 6:11:00 PM By: Worthy Keeler PA-C Signed: 04/24/2022 12:08:06 PM By: [name] RN Entered By: [name] on 04/20/2022 15:34:24 -------------------------------------------------------------------------------- HPI Details Patient Name: Date of Service: [name] 04/20/2022 3:00 PM Medical Record Number: [id number] Patient Account Number: [id number] Date of Birth/Sex: Treating RN: [birth year] (37 y.o. 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 He states this began somewhere around January 24, 2022. Subsequently he has been seen by his primary care provider who did refer him to [place] I believe and subsequently they placed him on antibiotics that was for doxycycline 14 days on December 29. 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 There has not been any recent x-rays and I do believe he needs to get an x-ray before next week's follow-up appointment. Nonetheless this does  actually go a little bit deeper than what I initially was hoping for and again it was obvious on the evaluation that something was not quite right. With that being said I do think that we can definitely start things as far as treatment is concerned and try to get this under control he does seem like this was more of a pressure  and abrasion type issue in regard to his prosthesis and how it fits on his leg he does have signs of some abrasion and skin changes due to this on the distal portion of his stump of the left leg which he tells me has been going on for quite some time. I think his prosthesis may not be fitting quite right. Patient has a history of spina bifida but otherwise really no other major medical problems other than what he is dealing with currently. 04-06-2022 upon evaluation today patient appears to be doing well with regard to his wound. He is actually making some signs of good improvement which is great news and overall I am extremely pleased with where we stand today. There does not appear to be any evidence of active infection locally nor systemically at this time which is great news. No fevers, chills, nausea, vomiting, or diarrhea. 04-13-2022 upon evaluation today patient appears to be doing well currently in regard to his wound. In fact it appears that this wound is significantly improved even compared to last time I saw him. I am very pleased in that regard I do believe that we should see about changing things up I do not believe the [facility] Blue rope is going to continue to benefit him going forward but this is simply due to the fact honestly that I believe it is going to keep it more open as opposed to allow it to continue to close and. In general I do think that we are headed in the right direction which is great news. No fevers, chills, nausea, vomiting, or diarrhea. 04-20-2022 upon evaluation today patient's wound actually is showing signs of improvement although he does have  some irritation external in regard to the wound. I do not see any signs of infection I think he is doing quite well in that regard. Electronic Signature(s) Signed: 04/20/2022 5:43:40 PM By: Worthy Keeler PA-C Entered By: Worthy Keeler on 04/20/2022 17:43:40 Vaughan Basta ([id number]) [id number] Page 3 of 7 -------------------------------------------------------------------------------- Physical Exam Details Patient Name: Date of Service: DERMOT, REVERS 04/20/2022 3:00 PM Medical Record Number: [id number] Patient Account Number: [id number] Date of Birth/Sex: Treating RN: [birth year] (36 y.o. [name] Primary Care Provider: [name] Other Clinician: Referring Provider: Treating Provider/Extender: [name] in Treatment: 3 Constitutional Well-nourished and well-hydrated in no acute distress. Respiratory normal breathing without difficulty. Psychiatric this patient is able to make decisions and demonstrates good insight into disease process. Alert and Oriented x 3. pleasant and cooperative. Notes Upon inspection patient's wound bed revealed that he does have some depth to the wound but it does seem to be getting better overall I am very pleased with where we stand I think he is headed in the right direction. Electronic Signature(s) Signed: 04/20/2022 5:44:02 PM By: Worthy Keeler PA-C Entered By: Worthy Keeler on 04/20/2022 17:44:02 -------------------------------------------------------------------------------- Physician Orders Details Patient Name: Date of Service: [name] 04/20/2022 3:00 PM Medical Record Number: [id number] Patient Account Number: [id number] Date of Birth/Sex: Treating RN: 1986/03/12 (36 y.o. [name] Primary Care Provider: [name] Other Clinician: Referring Provider: Treating Provider/Extender: [name] in Treatment: 3 Verbal /  Phone Orders: No Diagnosis Coding ICD-10 Coding Code Description [phone] Pressure ulcer of other site, stage 3 Z89.512 Acquired absence of left leg below knee Q05.9 Spina bifida, unspecified Follow-up Appointments Return Appointment in 1 week. [address] DAN, MICKEL ([id number]) [id number] Page 4  of 7 May shower; gently cleanse wound with antibacterial soap, rinse and pat dry prior to dressing wounds Wound Treatment Wound #1 - Amputation Site - Below Knee Wound Laterality: Left, Midline Cleanser: Soap and Water 1 x Per Day/30 Days Discharge Instructions: Gently cleanse wound with antibacterial soap, rinse and pat dry prior to dressing wounds Prim Dressing: Prisma 4.34 (in) 1 x Per Day/30 Days ary Discharge Instructions: pack into wound dry then moisten with saline Secondary Dressing: (BORDER) Zetuvit Plus SILICONE BORDER Dressing 4x4 (in/in) 1 x Per Day/30 Days Discharge Instructions: Please do not put silicone bordered dressings under wraps. Use non-bordered dressing only. Electronic Signature(s) Signed: 04/20/2022 6:11:00 PM By: Worthy Keeler PA-C Signed: 04/24/2022 12:08:06 PM By: [name] RN Entered By: [name] on 04/20/2022 15:32:52 -------------------------------------------------------------------------------- Problem List Details Patient Name: Date of Service: [name] 04/20/2022 3:00 PM Medical Record Number: [id number] Patient Account Number: [id number] Date of Birth/Sex: Treating RN: 05-27-1985 (37 y.o. [name] Primary Care Provider: [name] Other Clinician: Referring Provider: Treating Provider/Extender: [name] in Treatment: 3 Active Problems ICD-10 Encounter Code Description Active Date MDM Diagnosis L89.893 Pressure ulcer of other site, stage 3 03/27/2022 No Yes Z89.512 Acquired absence of left leg below knee 03/27/2022 No Yes Q05.9 Spina bifida,  unspecified 03/27/2022 No Yes Inactive Problems Resolved Problems Electronic Signature(s) Signed: 04/20/2022 3:28:10 PM By: Worthy Keeler PA-C Entered By: Worthy Keeler on 04/20/2022 15:28:10 Vaughan Basta ([id number]) [id number] Page 5 of 7 -------------------------------------------------------------------------------- Progress Note Details Patient Name: Date of Service: [name], [name] 04/20/2022 3:00 PM Medical Record Number: [id number] Patient Account Number: [id number] Date of Birth/Sex: Treating RN: 01-Jul-1985 (36 y.o. 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 He states this began somewhere around January 24, 2022. Subsequently he has been seen by his primary care provider who did refer him to [place] I believe and subsequently they placed him on antibiotics that was for doxycycline 14 days on December 29. 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 There has not been any recent x-rays and I do believe he needs to get an x-ray before next week's follow- up appointment. Nonetheless this does actually go a little bit deeper than what I initially was hoping for and again it was obvious on the evaluation that something was not quite right. With that being said I do think that we can definitely start things as far as  treatment is concerned and try to get this under control he does seem like this was more of a pressure and abrasion type issue in regard to his prosthesis and how it fits on his leg he does have signs of some abrasion and skin changes due to this on the distal portion of his stump of the left leg which he tells me has been going on for quite some time. I think his prosthesis may not be fitting  quite right. Patient has a history of spina bifida but otherwise really no other major medical problems other than what he is dealing with currently. 04-06-2022 upon evaluation today patient appears to be doing well with regard to his wound. He is actually making some signs of good improvement which is great news and overall I am extremely pleased with where we stand today. There does not appear to be any evidence of active infection locally nor systemically at this time which is great news. No fevers, chills, nausea, vomiting, or diarrhea. 04-13-2022 upon evaluation today patient appears to be doing well currently in regard to his wound. In fact it appears that this wound is significantly improved even compared to last time I saw him. I am very pleased in that regard I do believe that we should see about changing things up I do not believe the [facility] Blue rope is going to continue to benefit him going forward but this is simply due to the fact honestly that I believe it is going to keep it more open as opposed to allow it to continue to close and. In general I do think that we are headed in the right direction which is great news. No fevers, chills, nausea, vomiting, or diarrhea. 04-20-2022 upon evaluation today patient's wound actually is showing signs of improvement although he does have some irritation external in regard to the wound. I do not see any signs of infection I think he is doing quite well in that regard. Objective Constitutional Well-nourished and well-hydrated in no acute distress. Vitals Time  Taken: 3:15 PM, Temperature: 98.1 F, Pulse: 134 bpm, Respiratory Rate: 16 breaths/min, Blood Pressure: 200/110 mmHg. Respiratory normal breathing without difficulty. Psychiatric this patient is able to make decisions and demonstrates good insight into disease process. Alert and Oriented x 3. pleasant and cooperative. General Notes: Upon inspection patient's wound bed revealed that he does have some depth to the wound but it does seem to be getting better overall I am very pleased with where we stand I think he is headed in the right direction. Integumentary (Hair, Skin) Wound #1 status is Open. Original cause of wound was Gradually Appeared. The date acquired was: 01/24/2022. The wound has been in treatment 3 weeks. The wound is located on the Left,Midline Amputation Site - Below Knee. The wound measures 0.5cm length x 1cm width x 0.9cm depth; 0.393cm^2 area and 0.353cm^3 volume. There is Fat Layer (Subcutaneous Tissue) exposed. There is no tunneling or undermining noted. There is a medium amount of Fellner, Lebaron C ([id number]) [id number] Page 6 of 7 serosanguineous drainage noted. There is large (67-100%) red, friable granulation within the wound bed. There is a small (1-33%) amount of necrotic tissue within the wound bed. Assessment Active Problems ICD-10 Pressure ulcer of other site, stage 3 Acquired absence of left leg below knee Spina bifida, unspecified Procedures Wound #1 Pre-procedure diagnosis of Wound #1 is a Pressure Ulcer located on the Left,Midline Amputation Site - Below Knee . There was a Excisional Skin/Subcutaneous Tissue Debridement with a total area of 0.5 sq cm performed by Tommie Sams., PA-C. With the following instrument(s): Curette to remove Viable and Non- Viable tissue/material. Material removed includes Subcutaneous Tissue, Slough, Skin: Dermis, and Skin: Epidermis after achieving pain control using Lidocaine 4% T opical Solution.  No specimens were taken. A time out was conducted at 15:30, prior to the start of the procedure. A Minimum amount of bleeding was controlled with Pressure. The procedure was tolerated well with  a pain level of 0 throughout and a pain level of 0 following the procedure. Post Debridement Measurements: 0.5cm length x 1cm width x 0.9cm depth; 0.353cm^3 volume. Post debridement Stage noted as Category/Stage III. Character of Wound/Ulcer Post Debridement is improved. Post procedure Diagnosis Wound #1: Same as Pre-Procedure Plan Follow-up Appointments: Return Appointment in 1 week. Bathing/ Shower/ Hygiene: May shower; gently cleanse wound with antibacterial soap, rinse and pat dry prior to dressing wounds WOUND #1: - Amputation Site - Below Knee Wound Laterality: Left, Midline Cleanser: Soap and Water 1 x Per Day/30 Days Discharge Instructions: Gently cleanse wound with antibacterial soap, rinse and pat dry prior to dressing wounds Prim Dressing: Prisma 4.34 (in) 1 x Per Day/30 Days ary Discharge Instructions: pack into wound dry then moisten with saline Secondary Dressing: (BORDER) Zetuvit Plus SILICONE BORDER Dressing 4x4 (in/in) 1 x Per Day/30 Days Discharge Instructions: Please do not put silicone bordered dressings under wraps. Use non-bordered dressing only. 1. I am going to suggest that we have the patient continue to monitor for any signs of infection or worsening. Based on what I am seeing I feel like he is doing quite well we just need to go ahead and make sure that there is no drainage collecting externally which will cause irritation at this point. He voiced understanding. 2. I am also going to recommend that the patient should continue to monitor for any signs of infection or worsening. Obviously if anything changes he knows contact the office and let me know. We will see patient back for reevaluation in 1 week here in the clinic. If anything worsens or changes patient will contact our  office for additional recommendations. Electronic Signature(s) Signed: 04/20/2022 5:44:34 PM By: Worthy Keeler PA-C Entered By: Worthy Keeler on 04/20/2022 17:44:33 SuperBill Details -------------------------------------------------------------------------------- Vaughan Basta ([id number] Page 7 of 7 Patient Name: Date of Service: [name], [name] [name] 04/20/2022 Medical Record Number: [id number] Patient Account Number: [id number] Date of Birth/Sex: Treating RN: 1986/02/17 (36 y.o. [name]) [name] Primary Care Provider: [name] Other Clinician: Referring Provider: Treating Provider/Extender: [name] in Treatment: 3 Diagnosis Coding ICD-10 Codes Code Description [phone] Pressure ulcer of other site, stage 3 Z89.512 Acquired absence of left leg below knee Q05.9 Spina bifida, unspecified Facility Procedures CPT4 Code Description Modifier Quantity [id number] - DEB SUBQ TISSUE 20 SQ CM/< 1 ICD-10 Diagnosis Description L89.893 Pressure ulcer of other site, stage 3 Physician Procedures Quantity CPT4 Code Description Modifier [id number] - WC PHYS SUBQ TISS 20 SQ CM 1 ICD-10 Diagnosis Description L89.893 Pressure ulcer of other site, stage 3 Electronic Signature(s) Signed: 04/20/2022 5:45:23 PM By: Worthy Keeler PA-C Entered By: Worthy Keeler on 04/20/2022 17:45:23

## 2022-04-24 NOTE — Progress Notes (Signed)
[name], [name] ([id number]) [id number] Page 1 of 8 Visit Report for 04/20/2022 Arrival Information Details Patient Name: Date of Service: [name], [name] 04/20/2022 3:00 PM Medical Record Number: [id number] Patient Account Number: [id number] Date of Birth/Sex: Treating RN: 12/04/1985 (36 y.o. [name]) [name] Primary Care [name]: [name] Other Clinician: Referring [name]: Treating [name]/Extender: [name] in Treatment: 3 Visit Information History Since Last Visit Added or deleted any medications: No Patient Arrived: Crutches Any new allergies or adverse reactions: No Arrival Time: 15:11 Had a fall or experienced change in No Accompanied By: self activities of daily living that may affect Transfer Assistance: None risk of falls: Patient Identification Verified: Yes Signs or symptoms of abuse/neglect since last visito No Secondary Verification Process Completed: Yes Hospitalized since last visit: No Patient Requires Transmission-Based Precautions: No Implantable device outside of the clinic excluding No Patient Has Alerts: No cellular tissue based products placed in the center since last visit: Has Dressing in Place as Prescribed: Yes Pain Present Now: No Electronic Signature(s) Signed: 04/24/2022 12:08:06 PM By: Jeffrey Coria RN Entered By: [name] on 04/20/2022 15:15:15 -------------------------------------------------------------------------------- Clinic Level of Care Assessment Details Patient Name: Date of Service: [name], [name] 04/20/2022 3:00 PM Medical Record Number: [id number] Patient Account Number: [id number] Date of Birth/Sex: Treating RN: [birth year] (36 y.o. [name] Primary Care [name]: [name] Other Clinician: Referring [name]: Treating [name]/Extender: [name] in Treatment: 3 Clinic Level of Care Assessment Items TOOL 1 Quantity  Score []$  - 0 Use when EandM and Procedure is performed on INITIAL visit ASSESSMENTS - Nursing Assessment / Reassessment []$  - 0 General Physical Exam (combine w/ comprehensive assessment (listed just below) when performed on new pt. evals) []$  - 0 Comprehensive Assessment (HX, ROS, Risk Assessments, Wounds Hx, etc.) [name] ([id number]) [phone].pdf Page 2 of 8 ASSESSMENTS - Wound and Skin Assessment / Reassessment []$  - 0 Dermatologic / Skin Assessment (not related to wound area) ASSESSMENTS - Ostomy and/or Continence Assessment and Care []$  - 0 Incontinence Assessment and Management []$  - 0 Ostomy Care Assessment and Management (repouching, etc.) PROCESS - Coordination of Care []$  - 0 Simple Patient / Family Education for ongoing care []$  - 0 Complex (extensive) Patient / Family Education for ongoing care []$  - 0 Staff obtains Programmer, systems, Records, T Results / Process Orders est []$  - 0 Staff telephones HHA, Nursing Homes / Clarify orders / etc []$  - 0 Routine Transfer to another Facility (non-emergent condition) []$  - 0 Routine Hospital Admission (non-emergent condition) []$  - 0 New Admissions / Biomedical engineer / Ordering NPWT Apligraf, etc. , []$  - 0 Emergency Hospital Admission (emergent condition) PROCESS - Special Needs []$  - 0 Pediatric / Minor Patient Management []$  - 0 Isolation Patient Management []$  - 0 Hearing / Language / Visual special needs []$  - 0 Assessment of Community assistance (transportation, D/Navarro planning, etc.) []$  - 0 Additional assistance / Altered mentation []$  - 0 Support Surface(s) Assessment (bed, cushion, seat, etc.) INTERVENTIONS - Miscellaneous []$  - 0 External ear exam []$  - 0 Patient Transfer (multiple staff / Civil Service fast streamer / Similar devices) []$  - 0 Simple Staple / Suture removal (25 or less) []$  - 0 Complex Staple / Suture removal (26 or more) []$  - 0 Hypo/Hyperglycemic Management (do not check if billed  separately) []$  - 0 Ankle / Brachial Index (ABI) - do not check if billed separately Has the patient been seen at the hospital within the last three  years: Yes Total Score: 0 Level Of Care: ____ Electronic Signature(s) Signed: 04/24/2022 12:08:06 PM By: Jeffrey Coria RN Entered By: [name] on 04/20/2022 15:34:32 -------------------------------------------------------------------------------- Encounter Discharge Information Details Patient Name: Date of Service: [name] 04/20/2022 3:00 PM Medical Record Number: [id number] Patient Account Number: [id number] Date of Birth/Sex: Treating RN: 1985-04-02 (36 y.o. [name] Primary Care [name]: [name] Other Clinician: Referring [name]: Treating [name]/Extender: [name] in Treatment: 3 [name] ([id number]) [id number] Page 3 of 8 Encounter Discharge Information Items Post Procedure Vitals Discharge Condition: Stable Temperature (F): 98.1 Ambulatory Status: Crutches Pulse (bpm): 134 Discharge Destination: Home Respiratory Rate (breaths/min): 16 Transportation: Private Auto Blood Pressure (mmHg): 200/110 Accompanied By: self Schedule Follow-up Appointment: Yes Clinical Summary of Care: Electronic Signature(s) Signed: 04/24/2022 12:08:06 PM By: Jeffrey Coria RN Entered By: [name] on 04/20/2022 15:35:36 -------------------------------------------------------------------------------- Lower Extremity Assessment Details Patient Name: Date of Service: [name], [name] 04/20/2022 3:00 PM Medical Record Number: [id number] Patient Account Number: [id number] Date of Birth/Sex: Treating RN: 05/21/1985 (36 y.o. [name] Primary Care [name]: [name] Other Clinician: Referring [name]: Treating [name]/Extender: [name] in Treatment: 3 Electronic Signature(s) Signed: 04/24/2022 12:08:06 PM By: Jeffrey Coria RN Entered By: [name] on 04/20/2022 15:21:53 -------------------------------------------------------------------------------- Multi Wound Chart Details Patient Name: Date of Service: [name] 04/20/2022 3:00 PM Medical Record Number: [id number] Patient Account Number: [id number] Date of Birth/Sex: Treating RN: September 30, 1985 (36 y.o. [name] Primary Care Nakya Weyand: [name] Other Clinician: Referring Lyndsy Gilberto: Treating Zayveon Raschke/Extender: [name] in Treatment: 3 Vital Signs Height(in): Pulse(bpm): 134 Weight(lbs): Blood Pressure(mmHg): 200/110 Body Mass Index(BMI): Temperature(F): 98.1 Respiratory Rate(breaths/min): 16 [1:Photos:] Shankel, Gwen Navarro ([id number]) [1:Photos:] [N/A:N/A] Left, Midline Amputation Site - Below N/A N/A Wound Location: Knee Gradually Appeared N/A N/A Wounding Event: Pressure Ulcer N/A N/A Primary Etiology: 01/24/2022 N/A N/A Date Acquired: 3 N/A N/A Weeks of Treatment: Open N/A N/A Wound Status: No N/A N/A Wound Recurrence: 0.5x1x0.9 N/A N/A Measurements L x W x D (cm) 0.393 N/A N/A A (cm) : rea 0.353 N/A N/A Volume (cm) : 85.30% N/A N/A % Reduction in A rea: 92.20% N/A N/A % Reduction in Volume: Category/Stage III N/A N/A Classification: Medium N/A N/A Exudate A mount: Serosanguineous N/A N/A Exudate Type: red, brown N/A N/A Exudate Color: Large (67-100%) N/A N/A Granulation A mount: Red, Friable N/A N/A Granulation Quality: Small (1-33%) N/A N/A Necrotic A mount: Fat Layer (Subcutaneous Tissue): Yes N/A N/A Exposed Structures: Fascia: No Tendon: No Muscle: No Joint: No Bone: No Medium (34-66%) N/A N/A Epithelialization: Treatment Notes Electronic Signature(s) Signed: 04/24/2022 12:08:06 PM By: Jeffrey Coria RN Entered By: [name] on 04/20/2022 15:22:00 -------------------------------------------------------------------------------- Multi-Disciplinary  Care Plan Details Patient Name: Date of Service: [name] 04/20/2022 3:00 PM Medical Record Number: [id number] Patient Account Number: [id number] Date of Birth/Sex: Treating RN: Jul 08, 1985 (36 y.o. [name] Primary Care Arsh Feutz: [name] Other Clinician: Referring Perl Kerney: Treating Marybell Robards/Extender: [name] in Treatment: 3 Active Inactive Wound/Skin Impairment Nursing Diagnoses: Impaired tissue integrity Knowledge deficit related to smoking impact on wound healing Knowledge deficit related to ulceration/compromised skin integrity [place], Paulanthony Navarro ([id number].pdf Page 5 of 8 Goals: Patient will have a decrease in wound volume by X% from date: (specify in notes) Date Initiated: 03/27/2022 Date Inactivated: 04/20/2022 Target Resolution Date: 04/27/2022 Goal Status: Met Patient/caregiver will verbalize understanding of skin care regimen Date Initiated: 03/27/2022  Target Resolution Date: 05/26/2022 Goal Status: Active Ulcer/skin breakdown will have a volume reduction of 30% by week 4 Date Initiated: 03/27/2022 Target Resolution Date: 04/27/2022 Goal Status: Active Ulcer/skin breakdown will have a volume reduction of 50% by week 8 Date Initiated: 03/27/2022 Target Resolution Date: 05/26/2022 Goal Status: Active Ulcer/skin breakdown will have a volume reduction of 80% by week 12 Date Initiated: 03/27/2022 Target Resolution Date: 06/26/2022 Goal Status: Active Ulcer/skin breakdown will heal within 14 weeks Date Initiated: 03/27/2022 Target Resolution Date: 07/10/2022 Goal Status: Active Interventions: Assess patient/caregiver ability to obtain necessary supplies Assess patient/caregiver ability to perform ulcer/skin care regimen upon admission and as needed Assess ulceration(s) every visit Provide education on ulcer and skin care Treatment Activities: Skin care regimen initiated :  03/27/2022 Notes: Electronic Signature(s) Signed: 04/24/2022 12:08:06 PM By: Jeffrey Coria RN Entered By: [name] on 04/20/2022 15:25:35 -------------------------------------------------------------------------------- Pain Assessment Details Patient Name: Date of Service: [name] 04/20/2022 3:00 PM Medical Record Number: [id number] Patient Account Number: [id number] Date of Birth/Sex: Treating RN: October 19, 1985 (36 y.o. [name] Primary Care Erandy Mceachern: [name] Other Clinician: Referring Ciana Simmon: Treating Marybella Ethier/Extender: [name] in Treatment: 3 Active Problems Location of Pain Severity and Description of Pain Patient Has Paino No Site Locations [name], [name] ([id number]) [phone].pdf Page 6 of 8 Pain Management and Medication Current Pain Management: Electronic Signature(s) Signed: 04/24/2022 12:08:06 PM By: Jeffrey Coria RN Entered By: [name] on 04/20/2022 15:15:56 -------------------------------------------------------------------------------- Patient/Caregiver Education Details Patient Name: Date of Service: [name] 2/5/2024andnbsp3:00 PM Medical Record Number: [id number] Patient Account Number: [id number] Date of Birth/Gender: Treating RN: [birth year] (36 y.o. [name] Primary Care Physician: [name] Other Clinician: Referring Physician: Treating Physician/Extender: [name] in Treatment: 3 Education Assessment Education Provided To: Patient Education Topics Provided Wound/Skin Impairment: Methods: Explain/Verbal Responses: State content correctly Electronic Signature(s) Signed: 04/24/2022 12:08:06 PM By: Jeffrey Coria RN Entered By: [name] on 04/20/2022 15:22:20 Vaughan Basta ([id number].pdf Page 7 of  8 -------------------------------------------------------------------------------- Wound Assessment Details Patient Name: Date of Service: [name], [name] 04/20/2022 3:00 PM Medical Record Number: [id number] Patient Account Number: [id number] Date of Birth/Sex: Treating RN: Mar 31, 1985 (36 y.o. [name]) [name] Primary Care Godric Lavell: [name] Other Clinician: Referring [place] Schweppe: Treating Ekansh Sherk/Extender: [name] in Treatment: 3 Wound Status Wound Number: 1 Primary Etiology: Pressure Ulcer Wound Location: Left, Midline Amputation Site - Below Knee Wound Status: Open Wounding Event: Gradually Appeared Date Acquired: 01/24/2022 Weeks Of Treatment: 3 Clustered Wound: No Photos Wound Measurements Length: (cm) 0.5 Width: (cm) 1 Depth: (cm) 0.9 Area: (cm) 0.393 Volume: (cm) 0.353 % Reduction in Area: 85.3% % Reduction in Volume: 92.2% Epithelialization: Medium (34-66%) Tunneling: No Undermining: No Wound Description Classification: Category/Stage III Exudate Amount: Medium Exudate Type: Serosanguineous Exudate Color: red, brown Foul Odor After Cleansing: No Slough/Fibrino No Wound Bed Granulation Amount: Large (67-100%) Exposed Structure Granulation Quality: Red, Friable Fascia Exposed: No Necrotic Amount: Small (1-33%) Fat Layer (Subcutaneous Tissue) Exposed: Yes Tendon Exposed: No Muscle Exposed: No Joint Exposed: No Bone Exposed: No Treatment Notes Wound #1 (Amputation Site - Below Knee) Wound Laterality: Left, Midline Cleanser Soap and Water Discharge Instruction: Gently cleanse wound with antibacterial soap, rinse and pat dry prior to dressing wounds Peri-Wound Care [name], [name] ([id number]) [phone].pdf Page 8 of 8 Topical Primary Dressing Prisma 4.34 (in) Discharge Instruction: pack into wound dry then moisten with saline Secondary Dressing (BORDER) Zetuvit Plus SILICONE BORDER Dressing  4x4 (in/in) Discharge Instruction: Please do not put silicone bordered dressings under wraps. Use non-bordered dressing only. Secured With Compression Wrap Compression Stockings Environmental education officer) Signed: 04/24/2022 12:08:06 PM By: Jeffrey Coria RN Entered By: [name] on 04/20/2022 15:21:19 -------------------------------------------------------------------------------- Vitals Details Patient Name: Date of Service: [name]. 04/20/2022 3:00 PM Medical Record Number: [id number] Patient Account Number: [id number] Date of Birth/Sex: Treating RN: Jan 18, 1986 (36 y.o. [name] Primary Care Metztli Sachdev: [name] Other Clinician: Referring Breeze Berringer: Treating Blossom Crume/Extender: [name] in Treatment: 3 Vital Signs Time Taken: 15:15 Temperature (F): 98.1 Pulse (bpm): 134 Respiratory Rate (breaths/min): 16 Blood Pressure (mmHg): 200/110 Reference Range: 80 - 120 mg / dl Electronic Signature(s) Signed: 04/24/2022 12:08:06 PM By: Jeffrey Coria RN Entered By: [name] on 04/20/2022 15:15:42

## 2022-04-28 ENCOUNTER — Encounter: Payer: BC Managed Care – PPO | Admitting: Physician Assistant

## 2022-04-28 DIAGNOSIS — L89893 Pressure ulcer of other site, stage 3: Secondary | ICD-10-CM | POA: Diagnosis not present

## 2022-04-28 NOTE — Progress Notes (Signed)
LEAM, SAYE ([id number]) [id number] Page 1 of 6 Visit Report for 04/28/2022 Chief Complaint Document Details Patient Name: Date of Service: [name], [name] [name] 04/28/2022 3:15 PM Medical Record Number: [id number] Patient Account Number: [id number] Date of Birth/Sex: Treating RN: 22-Sep-1985 (37 y.o. [name] Primary Care Provider: [name] Other Clinician: Referring Provider: Treating Provider/Extender: [name] in Treatment: 4 Information Obtained from: Patient Chief Complaint Pressure ulcer left BKA location Electronic Signature(s) Signed: 04/28/2022 3:28:23 PM By: Worthy Keeler PA-Navarro Entered By: Worthy Keeler on 04/28/2022 15:28:22 -------------------------------------------------------------------------------- HPI Details Patient Name: Date of Service: [name], [name] 04/28/2022 3:15 PM Medical Record Number: [id number] Patient Account Number: [id number] Date of Birth/Sex: Treating RN: Mar 10, 1986 (37 y.o. 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 He states this began somewhere around January 24, 2022. Subsequently he has been seen by his primary care provider who did refer him to [place] I believe and subsequently they placed him on antibiotics that was for doxycycline 14 days on December 29. 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 There has not  been any recent x-rays and I do believe he needs to get an x-ray before next week's follow-up appointment. Nonetheless this does actually go a little bit deeper than what I initially was hoping for and again it was obvious on the evaluation that something was not quite right. With that being said I do think that we can definitely start things as far as treatment is concerned and try to get this under control he does seem like this was more of a pressure and abrasion type issue in regard to his prosthesis and how it fits on his leg he does have signs of some abrasion and skin changes due to this on the distal portion of his stump of the left leg which he tells me has been going on for quite some time. I think his prosthesis may not be fitting quite right. Patient has a history of spina bifida but otherwise really no other major medical problems other than what he is dealing with currently. 04-06-2022 upon evaluation today patient appears to be doing well with regard to his wound. He is actually making some signs of good improvement which is great news and overall I am extremely pleased with where we stand today. There does not appear to be any evidence of active infection locally nor systemically at this time which is great news. No fevers, chills, nausea, vomiting, or diarrhea. [name], [name] ([id number]) [id number] Page 2 of 6 04-13-2022 upon evaluation today patient appears to be doing well currently in regard to his wound. In fact it appears that this wound is significantly improved even compared to last time I saw him. I am very pleased in that regard I do believe that we should see about changing things up I do not believe the [facility] Blue rope is going to continue to benefit him going forward but this is simply due to  the fact honestly that I believe it is going to keep it more open as opposed to allow it to continue to close and. In general I do think that we are  headed in the right direction which is great news. No fevers, chills, nausea, vomiting, or diarrhea. 04-20-2022 upon evaluation today patient's wound actually is showing signs of improvement although he does have some irritation external in regard to the wound. I do not see any signs of infection I think he is doing quite well in that regard. 04-28-2022 upon evaluation today patient appears to be doing excellent in regard to the leg ulcer. He is tolerating the dressing changes without complication and the wound is much smaller than last week's evaluation is also not nearly as deep. Electronic Signature(s) Signed: 04/28/2022 3:47:36 PM By: Worthy Keeler PA-Navarro Entered By: Worthy Keeler on 04/28/2022 15:47:35 -------------------------------------------------------------------------------- Physical Exam Details Patient Name: Date of Service: Jeffrey Eastham, [name] 04/28/2022 3:15 PM Medical Record Number: [id number] Patient Account Number: [id number] Date of Birth/Sex: Treating RN: [birth year] (37 y.o. [name] Primary Care Provider: [name] Other Clinician: Referring Provider: Treating Provider/Extender: [name] in Treatment: 4 Constitutional Well-nourished and well-hydrated in no acute distress. Respiratory normal breathing without difficulty. Psychiatric this patient is able to make decisions and demonstrates good insight into disease process. Alert and Oriented x 3. pleasant and cooperative. Notes Upon inspection patient's wound bed actually showed signs of good granulation epithelization at this point. Fortunately I see no signs of active infection which is great news and overall I do believe that we are on the right track. Electronic Signature(s) Signed: 04/28/2022 3:47:48 PM By: Worthy Keeler PA-Navarro Entered By: Worthy Keeler on 04/28/2022 15:47:48 -------------------------------------------------------------------------------- Physician Orders  Details Patient Name: Date of Service: [name] 04/28/2022 3:15 PM Medical Record Number: [id number] Patient Account Number: [id number] Date of Birth/Sex: Treating RN: 01/30/1986 (37 y.o. [name] Primary Care Provider: [name] Other Clinician: CURT, DUPLER ([id number]) [id number] Page 3 of 6 Referring Provider: Treating Provider/Extender: [name] in Treatment: 4 Verbal / Phone Orders: No Diagnosis Coding ICD-10 Coding Code Description [phone] Pressure ulcer of other site, stage 3 Z89.512 Acquired absence of left leg below knee Q05.9 Spina bifida, unspecified Follow-up Appointments Return Appointment in 1 week. Bathing/ Shower/ Hygiene May shower; gently cleanse wound with antibacterial soap, rinse and pat dry prior to dressing wounds Wound Treatment Wound #1 - Amputation Site - Below Knee Wound Laterality: Left, Midline Cleanser: Soap and Water 1 x Per Day/30 Days Discharge Instructions: Gently cleanse wound with antibacterial soap, rinse and pat dry prior to dressing wounds Prim Dressing: Prisma 4.34 (in) 1 x Per Day/30 Days ary Discharge Instructions: pack into wound dry then moisten with saline Secondary Dressing: (BORDER) Zetuvit Plus SILICONE BORDER Dressing 4x4 (in/in) 1 x Per Day/30 Days Discharge Instructions: Please do not put silicone bordered dressings under wraps. Use non-bordered dressing only. Electronic Signature(s) Signed: 04/28/2022 5:36:51 PM By: Worthy Keeler PA-Navarro Signed: 04/29/2022 4:19:55 PM By: Rosalio Loud MSN RN CNS WTA Entered By: Rosalio Loud on 04/28/2022 15:30:01 -------------------------------------------------------------------------------- Problem List Details Patient Name: Date of Service: [name] 04/28/2022 3:15 PM Medical Record Number: [id number] Patient Account Number: [id number] Date of Birth/Sex: Treating RN: 01-20-1986 (37 y.o. [name] Primary Care Provider: [name] Other Clinician: Referring Provider: Treating Provider/Extender: [name] in Treatment: 4 Active Problems ICD-10 Encounter Code Description  Active Date MDM Diagnosis L89.893 Pressure ulcer of other site, stage 3 03/27/2022 No Yes Z89.512 Acquired absence of left leg below knee 03/27/2022 No Yes Q05.9 Spina bifida, unspecified 03/27/2022 No Yes [name], [name] ([id number]) [id number] Page 4 of 6 Inactive Problems Resolved Problems Electronic Signature(s) Signed: 04/28/2022 3:28:19 PM By: Worthy Keeler PA-Navarro Entered By: Worthy Keeler on 04/28/2022 15:28:19 -------------------------------------------------------------------------------- Progress Note Details Patient Name: Date of Service: [name], [name] 04/28/2022 3:15 PM Medical Record Number: [id number] Patient Account Number: [id number] Date of Birth/Sex: Treating RN: [birth year] (37 y.o. 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 He states this began somewhere around January 24, 2022. Subsequently he has been seen by his primary care provider who did refer him to [place] I believe and subsequently they placed him on antibiotics that was for doxycycline 14 days on December 29. I did review his culture results from that time as well and it showed that he did have MRSA and the doxycycline was an appropriate treatment for that so he obviously seems to be doing better at this  point which is great news. There has not been any recent x-rays and I do believe he needs to get an x-ray before next week's follow- up appointment. Nonetheless this does actually go a little bit deeper than what I initially was hoping for and again it was obvious on the evaluation that something was not quite right. With that being said I do think that we can definitely start things as far as treatment is concerned and try to get this under control he does seem like this was more of a pressure and abrasion type issue in regard to his prosthesis and how it fits on his leg he does have signs of some abrasion and skin changes due to this on the distal portion of his stump of the left leg which he tells me has been going on for quite some time. I think his prosthesis may not be fitting quite right. Patient has a history of spina bifida but otherwise really no other major medical problems other than what he is dealing with currently. 04-06-2022 upon evaluation today patient appears to be doing well with regard to his wound. He is actually making some signs of good improvement which is great news and overall I am extremely pleased with where we stand today. There does not appear to be any evidence of active infection locally nor systemically at this time which is great news. No fevers, chills, nausea, vomiting, or diarrhea. 04-13-2022 upon evaluation today patient appears to be doing well currently in regard to his wound. In fact it appears that this wound is significantly improved even compared to last time I saw him. I am very pleased in that regard I do believe that we should see about changing things up I do not believe the [facility] rope is going to continue to benefit him going forward but this is simply due to the fact honestly that I believe it is going to keep it more open as opposed to allow it to continue to close and. In general I do  think that we are headed in the right direction which is  great news. No fevers, chills, nausea, vomiting, or diarrhea. 04-20-2022 upon evaluation today patient's wound actually is showing signs of improvement although he does have some irritation external in regard to the wound. I do not see any signs of infection I think he is doing quite well in that regard. 04-28-2022 upon evaluation today patient appears to be doing excellent in regard to the leg ulcer. He is tolerating the dressing changes without complication and the wound is much smaller than last week's evaluation is also not nearly as deep. Objective [name], [name] ([id number]) [id number] Page 5 of 6 Constitutional Well-nourished and well-hydrated in no acute distress. Vitals Time Taken: 3:19 PM, Temperature: 98.1 F, Respiratory Rate: 16 breaths/min. General Notes: Unable to obtain BP pt nervous and didn't want me to take until end of visit then we both didn't remember!! Respiratory normal breathing without difficulty. Psychiatric this patient is able to make decisions and demonstrates good insight into disease process. Alert and Oriented x 3. pleasant and cooperative. General Notes: Upon inspection patient's wound bed actually showed signs of good granulation epithelization at this point. Fortunately I see no signs of active infection which is great news and overall I do believe that we are on the right track. Integumentary (Hair, Skin) Wound #1 status is Open. Original cause of wound was Gradually Appeared. The date acquired was: 01/24/2022. The wound has been in treatment 4 weeks. The wound is located on the Left,Midline Amputation Site - Below Knee. The wound measures 0.5cm length x 0.7cm width x 0.3cm depth; 0.275cm^2 area and 0.082cm^3 volume. There is Fat Layer (Subcutaneous Tissue) exposed. There is a medium amount of serosanguineous drainage noted. There is large (67- 100%) red, friable granulation within the wound bed. There is a small (1-33%) amount  of necrotic tissue within the wound bed. Assessment Active Problems ICD-10 Pressure ulcer of other site, stage 3 Acquired absence of left leg below knee Spina bifida, unspecified Plan Follow-up Appointments: Return Appointment in 1 week. Bathing/ Shower/ Hygiene: May shower; gently cleanse wound with antibacterial soap, rinse and pat dry prior to dressing wounds WOUND #1: - Amputation Site - Below Knee Wound Laterality: Left, Midline Cleanser: Soap and Water 1 x Per Day/30 Days Discharge Instructions: Gently cleanse wound with antibacterial soap, rinse and pat dry prior to dressing wounds Prim Dressing: Prisma 4.34 (in) 1 x Per Day/30 Days ary Discharge Instructions: pack into wound dry then moisten with saline Secondary Dressing: (BORDER) Zetuvit Plus SILICONE BORDER Dressing 4x4 (in/in) 1 x Per Day/30 Days Discharge Instructions: Please do not put silicone bordered dressings under wraps. Use non-bordered dressing only. 1. I am going to recommend that we have the patient continue with the collagen I think this is doing a great job here. 2. I am also can recommend the patient should continue to elevate his legs much as possible to help with edema control obviously is not swelling too bad which is good also believe he is using the shrinker which is also good. 3. We will also have him continue to look for any signs of infection right now this is really doing quite well. We will see patient back for reevaluation in 1 week here in the clinic. If anything worsens or changes patient will contact our office for additional recommendations. Electronic Signature(s) Signed: 04/28/2022 3:48:27 PM By: Worthy Keeler PA-Navarro Entered By: Worthy Keeler on 04/28/2022 15:48:27 [name] ([id number]) [id number] Page 6  of 6 -------------------------------------------------------------------------------- SuperBill Details Patient Name: Date of Service: [name], [name] [name] 04/28/2022 Medical Record Number: [id number] Patient Account Number: [id number] Date of Birth/Sex: Treating RN: February 10, 1986 (37 y.o. [name] Primary Care Provider: [name] Other Clinician: Referring Provider: Treating Provider/Extender: [name] in Treatment: 4 Diagnosis Coding ICD-10 Codes Code Description [phone] Pressure ulcer of other site, stage 3 Z89.512 Acquired absence of left leg below knee Q05.9 Spina bifida, unspecified Facility Procedures : CPT4 Code: [id number] Description: 99213 - WOUND CARE VISIT-LEV 3 EST PT Modifier: Quantity: 1 Physician Procedures : CPT4 Code Description Modifier [id number] - WC PHYS LEVEL 3 - EST PT ICD-10 Diagnosis Description L89.893 Pressure ulcer of other site, stage 3 Z89.512 Acquired absence of left leg below knee Q05.9 Spina bifida, unspecified Quantity: 1 Electronic Signature(s) Signed: 04/28/2022 3:50:04 PM By: Worthy Keeler PA-Navarro Entered By: Worthy Keeler on 04/28/2022 15:50:04

## 2022-04-30 NOTE — Progress Notes (Signed)
[name], [name] ([id number]) [id number] Page 1 of 9 Visit Report for 04/28/2022 Arrival Information Details Patient Name: Date of Service: [name], [name] 04/28/2022 3:15 PM Medical Record Number: [id number] Patient Account Number: [id number] Date of Birth/Sex: Treating RN: 05/11/1985 (37 y.o. [name] Primary Care Remy Voiles: [name] Other Clinician: Referring Khalifa Knecht: Treating Adriella Essex/Extender: [name] in Treatment: 4 Visit Information History Since Last Visit Added or deleted any medications: No Patient Arrived: Ambulatory Any new allergies or adverse reactions: No Arrival Time: 15:17 Had a fall or experienced change in No Accompanied By: self activities of daily living that may affect Transfer Assistance: None risk of falls: Patient Identification Verified: Yes Hospitalized since last visit: No Secondary Verification Process Completed: Yes Has Dressing in Place as Prescribed: Yes Patient Requires Transmission-Based Precautions: No Pain Present Now: No Patient Has Alerts: No Electronic Signature(s) Signed: 04/29/2022 4:19:55 PM By: Rosalio Loud MSN RN CNS WTA Entered By: Rosalio Loud on 04/28/2022 15:41:48 -------------------------------------------------------------------------------- Clinic Level of Care Assessment Details Patient Name: Date of Service: [name], [name] 04/28/2022 3:15 PM Medical Record Number: [id number] Patient Account Number: [id number] Date of Birth/Sex: Treating RN: 1985/05/11 (37 y.o. [name] Primary Care Adorian Gwynne: [name] Other Clinician: Referring Orpah Hausner: Treating Kharee Lesesne/Extender: [name] in Treatment: 4 Clinic Level of Care Assessment Items TOOL 4 Quantity Score X- 1 0 Use when only an EandM is performed on FOLLOW-UP visit ASSESSMENTS - Nursing Assessment / Reassessment X- 1 10 Reassessment of Co-morbidities  (includes updates in patient status) X- 1 5 Reassessment of Adherence to Treatment Plan ASSESSMENTS - Wound and Skin A ssessment / Reassessment X - Simple Wound Assessment / Reassessment - one wound 1 5 []$  - 0 Complex Wound Assessment / Reassessment - multiple wounds Milhouse, Kingstin C ([id number]) [id number] Page 2 of 9 []$  - 0 Dermatologic / Skin Assessment (not related to wound area) ASSESSMENTS - Focused Assessment []$  - 0 Circumferential Edema Measurements - multi extremities []$  - 0 Nutritional Assessment / Counseling / Intervention []$  - 0 Lower Extremity Assessment (monofilament, tuning fork, pulses) []$  - 0 Peripheral Arterial Disease Assessment (using hand held doppler) ASSESSMENTS - Ostomy and/or Continence Assessment and Care []$  - 0 Incontinence Assessment and Management []$  - 0 Ostomy Care Assessment and Management (repouching, etc.) PROCESS - Coordination of Care X - Simple Patient / Family Education for ongoing care 1 15 []$  - 0 Complex (extensive) Patient / Family Education for ongoing care X- 1 10 Staff obtains Programmer, systems, Records, T Results / Process Orders est []$  - 0 Staff telephones HHA, Nursing Homes / Clarify orders / etc []$  - 0 Routine Transfer to another Facility (non-emergent condition) []$  - 0 Routine Hospital Admission (non-emergent condition) []$  - 0 New Admissions / Biomedical engineer / Ordering NPWT Apligraf, etc. , []$  - 0 Emergency Hospital Admission (emergent condition) X- 1 10 Simple Discharge Coordination []$  - 0 Complex (extensive) Discharge Coordination PROCESS - Special Needs []$  - 0 Pediatric / Minor Patient Management []$  - 0 Isolation Patient Management []$  - 0 Hearing / Language / Visual special needs []$  - 0 Assessment of Community assistance (transportation, D/C planning, etc.) []$  - 0 Additional assistance / Altered mentation []$  - 0 Support Surface(s) Assessment (bed, cushion, seat,  etc.) INTERVENTIONS - Wound Cleansing / Measurement X - Simple Wound Cleansing - one wound 1 5 []$  - 0 Complex Wound Cleansing - multiple wounds X- 1 5 Wound Imaging (photographs - any number of wounds) []$  -  0 Wound Tracing (instead of photographs) X- 1 5 Simple Wound Measurement - one wound []$  - 0 Complex Wound Measurement - multiple wounds INTERVENTIONS - Wound Dressings X - Small Wound Dressing one or multiple wounds 1 10 []$  - 0 Medium Wound Dressing one or multiple wounds []$  - 0 Large Wound Dressing one or multiple wounds []$  - 0 Application of Medications - topical []$  - 0 Application of Medications - injection INTERVENTIONS - Miscellaneous []$  - 0 External ear exam []$  - 0 Specimen Collection (cultures, biopsies, blood, body fluids, etc.) []$  - 0 Specimen(s) / Culture(s) sent or taken to Lab for analysis [name], [name] ([id number]) [id number] Page 3 of 9 []$  - 0 Patient Transfer (multiple staff / Civil Service fast streamer / Similar devices) []$  - 0 Simple Staple / Suture removal (25 or less) []$  - 0 Complex Staple / Suture removal (26 or more) []$  - 0 Hypo / Hyperglycemic Management (close monitor of Blood Glucose) []$  - 0 Ankle / Brachial Index (ABI) - do not check if billed separately X- 1 5 Vital Signs Has the patient been seen at the hospital within the last three years: Yes Total Score: 85 Level Of Care: New/Established - Level 3 Electronic Signature(s) Signed: 04/29/2022 4:19:55 PM By: Rosalio Loud MSN RN CNS WTA Entered By: Rosalio Loud on 04/28/2022 15:31:42 -------------------------------------------------------------------------------- Encounter Discharge Information Details Patient Name: Date of Service: [name] 04/28/2022 3:15 PM Medical Record Number: [id number] Patient Account Number: [id number] Date of Birth/Sex: Treating RN: 01/09/1986 (37 y.o. [name] Primary Care Leovardo Thoman: [name] Other Clinician: Referring  Cerita Rabelo: Treating Meaghan Whistler/Extender: [name] in Treatment: 4 Encounter Discharge Information Items Discharge Condition: Stable Ambulatory Status: Crutches Discharge Destination: Home Transportation: Private Auto Accompanied By: self Schedule Follow-up Appointment: Yes Clinical Summary of Care: Electronic Signature(s) Signed: 04/29/2022 4:19:55 PM By: Rosalio Loud MSN RN CNS WTA Entered By: Rosalio Loud on 04/28/2022 15:32:54 -------------------------------------------------------------------------------- Lower Extremity Assessment Details Patient Name: Date of Service: Jeffreys, [name] 04/28/2022 3:15 PM Medical Record Number: [id number] Patient Account Number: [id number] Date of Birth/Sex: Treating RN: [birth year] (37 y.o. [name] Primary Care Daymien Goth: [name] Other Clinician: Referring Consuella Scurlock: Treating Rita Vialpando/Extender: Joanell Rising [place], DALANTE LEBSACK ([id number]) [id number] Page 4 of 9 Weeks in Treatment: 4 Electronic Signature(s) Signed: 04/29/2022 4:19:55 PM By: Rosalio Loud MSN RN CNS WTA Entered By: Rosalio Loud on 04/28/2022 15:26:03 -------------------------------------------------------------------------------- Multi Wound Chart Details Patient Name: Date of Service: [name] 04/28/2022 3:15 PM Medical Record Number: [id number] Patient Account Number: [id number] Date of Birth/Sex: Treating RN: 05/16/1985 (37 y.o. [name] Primary Care Kaitlyn Skowron: [name] Other Clinician: Referring Lillyth Spong: Treating Meesha Sek/Extender: [name] in Treatment: 4 [1:Photos:] [N/A:N/A] Left, Midline Amputation Site - Below N/A N/A Wound Location: Knee Gradually Appeared N/A N/A Wounding Event: Pressure Ulcer N/A N/A Primary Etiology: 01/24/2022 N/A N/A Date Acquired: 4 N/A N/A Weeks of Treatment: Open N/A N/A Wound Status: No N/A  N/A Wound Recurrence: 0.5x0.7x0.3 N/A N/A Measurements L x W x D (cm) 0.275 N/A N/A A (cm) : rea 0.082 N/A N/A Volume (cm) : 89.70% N/A N/A % Reduction in A rea: 98.20% N/A N/A % Reduction in Volume: Category/Stage III N/A N/A Classification: Medium N/A N/A Exudate A mount: Serosanguineous N/A N/A Exudate Type: red, brown N/A N/A Exudate Color: Large (67-100%) N/A N/A Granulation A mount: Red, Friable N/A N/A Granulation Quality: Small (1-33%) N/A N/A Necrotic A mount: Fat Layer (Subcutaneous  Tissue): Yes N/A N/A Exposed Structures: Fascia: No Tendon: No Muscle: No Joint: No Bone: No Medium (34-66%) N/A N/A Epithelialization: Treatment Notes Electronic Signature(s) Signed: 04/29/2022 4:19:55 PM By: Rosalio Loud MSN RN CNS WTA Entered By: Rosalio Loud on 04/28/2022 15:26:08 [name], [name] ([id number]) [id number] Page 5 of 9 -------------------------------------------------------------------------------- Multi-Disciplinary Care Plan Details Patient Name: Date of Service: [name], [name] [name] 04/28/2022 3:15 PM Medical Record Number: [id number] Patient Account Number: [id number] Date of Birth/Sex: Treating RN: 01-May-1985 (37 y.o. [name] Primary Care Joab Carden: [name] Other Clinician: Referring Tabby Beaston: Treating Andren Bethea/Extender: [name] in Treatment: 4 Active Inactive Wound/Skin Impairment Nursing Diagnoses: Impaired tissue integrity Knowledge deficit related to smoking impact on wound healing Knowledge deficit related to ulceration/compromised skin integrity Goals: Patient will have a decrease in wound volume by X% from date: (specify in notes) Date Initiated: 03/27/2022 Date Inactivated: 04/20/2022 Target Resolution Date: 04/27/2022 Goal Status: Met Patient/caregiver will verbalize understanding of skin care regimen Date Initiated: 03/27/2022 Target Resolution Date: 05/26/2022 Goal  Status: Active Ulcer/skin breakdown will have a volume reduction of 30% by week 4 Date Initiated: 03/27/2022 Target Resolution Date: 04/27/2022 Goal Status: Active Ulcer/skin breakdown will have a volume reduction of 50% by week 8 Date Initiated: 03/27/2022 Target Resolution Date: 05/26/2022 Goal Status: Active Ulcer/skin breakdown will have a volume reduction of 80% by week 12 Date Initiated: 03/27/2022 Target Resolution Date: 06/26/2022 Goal Status: Active Ulcer/skin breakdown will heal within 14 weeks Date Initiated: 03/27/2022 Target Resolution Date: 07/10/2022 Goal Status: Active Interventions: Assess patient/caregiver ability to obtain necessary supplies Assess patient/caregiver ability to perform ulcer/skin care regimen upon admission and as needed Assess ulceration(s) every visit Provide education on ulcer and skin care Treatment Activities: Skin care regimen initiated : 03/27/2022 Notes: Electronic Signature(s) Signed: 04/29/2022 4:19:55 PM By: Rosalio Loud MSN RN CNS WTA Entered By: Rosalio Loud on 04/28/2022 15:32:08 [name] ([id number]) [id number] Page 6 of 9 -------------------------------------------------------------------------------- Pain Assessment Details Patient Name: Date of Service: [name], [name] 04/28/2022 3:15 PM Medical Record Number: [id number] Patient Account Number: [id number] Date of Birth/Sex: Treating RN: [birth year] (37 y.o. [name] Primary Care Cerinity Zynda: [name] Other Clinician: Referring Cornelius Marullo: Treating Jaila Schellhorn/Extender: [name] in Treatment: 4 Active Problems Location of Pain Severity and Description of Pain Patient Has Paino No Site Locations Pain Management and Medication Current Pain Management: Electronic Signature(s) Signed: 04/29/2022 4:19:55 PM By: Rosalio Loud MSN RN CNS WTA Entered By: Rosalio Loud on 04/28/2022  15:19:53 -------------------------------------------------------------------------------- Patient/Caregiver Education Details Patient Name: Date of Service: [name] 2/13/2024andnbsp3:15 PM Medical Record Number: [id number] Patient Account Number: [id number] Date of Birth/Gender: Treating RN: 1986-02-17 (36 y.o. [name] Primary Care Physician: [name] Other Clinician: Referring Physician: Treating Physician/Extender: [name] in Treatment: 4 [name], Jeffrey C ([id number]) [id number] Page 7 of 9 Education Assessment Education Provided To: Patient Education Topics Provided Wound/Skin Impairment: Handouts: Caring for Your Ulcer Methods: Explain/Verbal Responses: State content correctly Electronic Signature(s) Signed: 04/29/2022 4:19:55 PM By: Rosalio Loud MSN RN CNS WTA Entered By: Rosalio Loud on 04/28/2022 15:32:03 -------------------------------------------------------------------------------- Wound Assessment Details Patient Name: Date of Service: Phung, [name] 04/28/2022 3:15 PM Medical Record Number: [id number] Patient Account Number: [id number] Date of Birth/Sex: Treating RN: [birth year] (37 y.o. [name] Primary Care Vineta Carone: [name] Other Clinician: Referring Taia Bramlett: Treating Alliene Klugh/Extender: [name] in Treatment: 4 Wound Status Wound Number: 1 Primary Etiology: Pressure Ulcer Wound Location:  Left, Midline Amputation Site - Below Knee Wound Status: Open Wounding Event: Gradually Appeared Date Acquired: 01/24/2022 Weeks Of Treatment: 4 Clustered Wound: No Photos Wound Measurements Length: (cm) 0.5 Width: (cm) 0.7 Depth: (cm) 0.3 Area: (cm) 0.275 Volume: (cm) 0.082 % Reduction in Area: 89.7% % Reduction in Volume: 98.2% Epithelialization: Medium (34-66%) Wound Description Classification: Category/Stage III Exudate Amount:  Medium Exudate Type: Serosanguineous [name], [name] C ([id number]) Exudate Color: red, brown Foul Odor After Cleansing: No Slough/Fibrino No [id number] Page 8 of 9 Wound Bed Granulation Amount: Large (67-100%) Exposed Structure Granulation Quality: Red, Friable Fascia Exposed: No Necrotic Amount: Small (1-33%) Fat Layer (Subcutaneous Tissue) Exposed: Yes Tendon Exposed: No Muscle Exposed: No Joint Exposed: No Bone Exposed: No Treatment Notes Wound #1 (Amputation Site - Below Knee) Wound Laterality: Left, Midline Cleanser Soap and Water Discharge Instruction: Gently cleanse wound with antibacterial soap, rinse and pat dry prior to dressing wounds Peri-Wound Care Topical Primary Dressing Prisma 4.34 (in) Discharge Instruction: pack into wound dry then moisten with saline Secondary Dressing (BORDER) Zetuvit Plus SILICONE BORDER Dressing 4x4 (in/in) Discharge Instruction: Please do not put silicone bordered dressings under wraps. Use non-bordered dressing only. Secured With Compression Wrap Compression Stockings Environmental education officer) Signed: 04/29/2022 4:19:55 PM By: Rosalio Loud MSN RN CNS WTA Entered By: Rosalio Loud on 04/28/2022 15:25:50 -------------------------------------------------------------------------------- Vitals Details Patient Name: Date of Service: [name] 04/28/2022 3:15 PM Medical Record Number: [id number] Patient Account Number: [id number] Date of Birth/Sex: Treating RN: 11-02-1985 (37 y.o. [name] Primary Care Cohl Behrens: [name] Other Clinician: Referring Gwen Edler: Treating Aubery Date/Extender: [name] in Treatment: 4 Vital Signs Time Taken: 15:19 Temperature (F): 98.1 Respiratory Rate (breaths/min): 16 Reference Range: 80 - 120 mg / dl Notes Unable to obtain BP pt nervous and didn't want me to take until end of visit then we both didn't  remember!! Electronic Signature(s) ALLAH, SLATEN ([id number]) [id number] Page 9 of 9 Signed: 04/29/2022 4:19:55 PM By: Rosalio Loud MSN RN CNS WTA Entered By: Rosalio Loud on 04/28/2022 15:43:08

## 2022-05-05 ENCOUNTER — Encounter: Payer: BC Managed Care – PPO | Admitting: Physician Assistant

## 2022-05-05 DIAGNOSIS — L89893 Pressure ulcer of other site, stage 3: Secondary | ICD-10-CM | POA: Diagnosis not present

## 2022-05-06 NOTE — Progress Notes (Addendum)
SHAQUILL, KAMSTRA ([id number]) [id number] Page 1 of 8 Visit Report for 05/05/2022 Arrival Information Details Patient Name: Date of Service: [name] [name] 05/05/2022 2:15 PM Medical Record Number: [id number] Patient Account Number: [id number] Date of Birth/Sex: Treating RN: 04/15/1985 (37 y.o. Sew[place] Meth Primary Care Renley Banwart: [name] Other Clinician: Referring Chick Cousins: Treating Dniya Neuhaus/Extender: [name] in Treatment: 5 Visit Information History Since Last Visit Added or deleted any medications: No Patient Arrived: Crutches Has Dressing in Place as Prescribed: Yes Arrival Time: 14:35 Pain Present Now: No Accompanied By: self Transfer Assistance: None Patient Identification Verified: Yes Secondary Verification Process Completed: Yes Patient Requires Transmission-Based Precautions: No Patient Has Alerts: No Electronic Signature(s) Signed: 05/05/2022 4:52:42 PM By: Rosalio Loud MSN RN CNS WTA Entered By: Rosalio Loud on 05/05/2022 15:17:24 -------------------------------------------------------------------------------- Clinic Level of Care Assessment Details Patient Name: Date of Service: [name], [name] 05/05/2022 2:15 PM Medical Record Number: [id number] Patient Account Number: [id number] Date of Birth/Sex: Treating RN: [birth year] (37 y.o. Seward Meth Primary Care Cannan Beeck: [name] Other Clinician: Referring Herve Haug: Treating Christain Mcraney/Extender: [name] in Treatment: 5 Clinic Level of Care Assessment Items TOOL 1 Quantity Score []$  - 0 Use when EandM and Procedure is performed on INITIAL visit ASSESSMENTS - Nursing Assessment / Reassessment []$  - 0 General Physical Exam (combine w/ comprehensive assessment (listed just below) when performed on new pt. evals) []$  - 0 Comprehensive Assessment (HX, ROS, Risk Assessments, Wounds Hx, etc.) ASSESSMENTS - Wound and  Skin Assessment / Reassessment []$  - 0 Dermatologic / Skin Assessment (not related to wound area) ASSESSMENTS - Ostomy and/or Continence Assessment and Care RAIDEL, HANEY ([id number]) [id number].pdf Page 2 of 8 []$  - 0 Incontinence Assessment and Management []$  - 0 Ostomy Care Assessment and Management (repouching, etc.) PROCESS - Coordination of Care []$  - 0 Simple Patient / Family Education for ongoing care []$  - 0 Complex (extensive) Patient / Family Education for ongoing care []$  - 0 Staff obtains Programmer, systems, Records, T Results / Process Orders est []$  - 0 Staff telephones HHA, Nursing Homes / Clarify orders / etc []$  - 0 Routine Transfer to another Facility (non-emergent condition) []$  - 0 Routine Hospital Admission (non-emergent condition) []$  - 0 New Admissions / Biomedical engineer / Ordering NPWT Apligraf, etc. , []$  - 0 Emergency Hospital Admission (emergent condition) PROCESS - Special Needs []$  - 0 Pediatric / Minor Patient Management []$  - 0 Isolation Patient Management []$  - 0 Hearing / Language / Visual special needs []$  - 0 Assessment of Community assistance (transportation, D/C planning, etc.) []$  - 0 Additional assistance / Altered mentation []$  - 0 Support Surface(s) Assessment (bed, cushion, seat, etc.) INTERVENTIONS - Miscellaneous []$  - 0 External ear exam []$  - 0 Patient Transfer (multiple staff / Civil Service fast streamer / Similar devices) []$  - 0 Simple Staple / Suture removal (25 or less) []$  - 0 Complex Staple / Suture removal (26 or more) []$  - 0 Hypo/Hyperglycemic Management (do not check if billed separately) []$  - 0 Ankle / Brachial Index (ABI) - do not check if billed separately Has the patient been seen at the hospital within the last three years: Yes Total Score: 0 Level Of Care: ____ Electronic Signature(s) Signed: 05/05/2022 4:52:42 PM By: Rosalio Loud MSN RN CNS WTA Entered By: Rosalio Loud on 05/05/2022  15:18:43 -------------------------------------------------------------------------------- Encounter Discharge Information Details Patient Name: Date of Service: [name], [name] 05/05/2022 2:15 PM Medical Record Number: [id number] Patient Account Number: [id number] Date  of Birth/Sex: Treating RN: 1986/02/04 (37 y.o. Seward Meth Primary Care Kitty Cadavid: [name] Other Clinician: Referring Karlena Luebke: Treating Kasiyah Platter/Extender: [name] in Treatment: 5 Encounter Discharge Information Items Post Procedure Vitals Discharge Condition: Stable Temperature (F): 97.7 Navarro, Jeffrey C ([id number]) [phone].pdf Page 3 of 8 Ambulatory Status: Crutches Pulse (bpm): 85 Discharge Destination: Home Respiratory Rate (breaths/min): 16 Transportation: Private Auto Blood Pressure (mmHg): 135/82 Accompanied By: self Schedule Follow-up Appointment: Yes Clinical Summary of Care: Electronic Signature(s) Signed: 05/05/2022 4:52:42 PM By: Rosalio Loud MSN RN CNS WTA Entered By: Rosalio Loud on 05/05/2022 15:19:04 -------------------------------------------------------------------------------- Lower Extremity Assessment Details Patient Name: Date of Service: [name], [name] 05/05/2022 2:15 PM Medical Record Number: [id number] Patient Account Number: [id number] Date of Birth/Sex: Treating RN: 1986/03/14 (37 y.o. Seward Meth Primary Care Jerold Yoss: [name] Other Clinician: Referring Layana Konkel: Treating Nylen Creque/Extender: [name] in Treatment: 5 Electronic Signature(s) Signed: 05/05/2022 4:52:42 PM By: Rosalio Loud MSN RN CNS WTA Entered By: Rosalio Loud on 05/05/2022 15:17:56 -------------------------------------------------------------------------------- Multi Wound Chart Details Patient Name: Date of Service: [name] 05/05/2022 2:15 PM Medical Record Number: [id number] Patient Account  Number: [id number] Date of Birth/Sex: Treating RN: 08-Jun-1985 (37 y.o. Seward Meth Primary Care Tinita Brooker: [name] Other Clinician: Referring Izaah Westman: Treating Joy Haegele/Extender: [name] in Treatment: 5 Vital Signs Height(in): Pulse(bpm): 58 Weight(lbs): Blood Pressure(mmHg): 135/82 Body Mass Index(BMI): Temperature(F): 97.7 Respiratory Rate(breaths/min): 16 [1:Photos:] [N/A:N/A] Left, Midline Amputation Site - Below N/A N/A Wound Location: Knee Gradually Appeared N/A N/A Wounding Event: Pressure Ulcer N/A N/A Primary Etiology: 01/24/2022 N/A N/A Date A cquired: 5 N/A N/A Weeks of Treatment: Open N/A N/A Wound Status: No N/A N/A Wound Recurrence: 0.2x0.2x0.2 N/A N/A Measurements L x W x D (cm) 0.031 N/A N/A A (cm) : rea 0.006 N/A N/A Volume (cm) : 98.80% N/A N/A % Reduction in A rea: 99.90% N/A N/A % Reduction in Volume: Category/Stage III N/A N/A Classification: Medium N/A N/A Exudate A mount: Serosanguineous N/A N/A Exudate Type: red, brown N/A N/A Exudate Color: Large (67-100%) N/A N/A Granulation A mount: Red, Friable N/A N/A Granulation Quality: Small (1-33%) N/A N/A Necrotic A mount: Fat Layer (Subcutaneous Tissue): Yes N/A N/A Exposed Structures: Fascia: No Tendon: No Muscle: No Joint: No Bone: No Medium (34-66%) N/A N/A Epithelialization: Debridement - Excisional N/A N/A Debridement: Subcutaneous, Slough N/A N/A Tissue Debrided: Skin/Subcutaneous Tissue N/A N/A Level: 0.01 N/A N/A Debridement A (sq cm): rea Curette N/A N/A Instrument: Minimum N/A N/A Bleeding: Pressure N/A N/A Hemostasis A chieved: Debridement Treatment Response: Procedure was tolerated well N/A N/A Post Debridement Measurements L x 0.3x0.3x0.1 N/A N/A W x D (cm) 0.007 N/A N/A Post Debridement Volume: (cm) Category/Stage III N/A N/A Post Debridement Stage: Debridement N/A N/A Procedures Performed: Treatment  Notes 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 Use non-bordered dressing only. Secured With Compression Wrap Compression Stockings Environmental education officer) Signed: 05/05/2022 4:52:42 PM By: Rosalio Loud MSN RN CNS WTA Entered By: Rosalio Loud on 05/05/2022 15:18:01 Vaughan Basta ([id number]) [phone].pdf Page 5 of 8 -------------------------------------------------------------------------------- Pain Assessment Details Patient Name: Date of Service:  [place], [name] 05/05/2022 2:15 PM Medical Record Number: [id number] Patient Account Number: [id number] Date of Birth/Sex: Treating RN: 10/30/1985 (37 y.o. Seward Meth Primary Care Hendricks Schwandt: [name] Other Clinician: Referring Kennisha Qin: Treating Terrace Fontanilla/Extender: [name] in Treatment: 5 Active Problems Location of Pain Severity and Description of Pain Patient Has Paino No Site Locations Pain Management and Medication Current Pain Management: Electronic Signature(s) Signed: 05/05/2022 4:52:42 PM By: Rosalio Loud MSN RN CNS WTA Entered By: Rosalio Loud on 05/05/2022 15:17:32 -------------------------------------------------------------------------------- Patient/Caregiver Education Details Patient Name: Date of Service: [name] 2/20/2024andnbsp2:15 PM Medical Record Number: [id number] Patient Account Number: [id number] Date of Birth/Gender: Treating RN: 1985-04-14 (37 y.o. Seward Meth Primary Care Physician: [name] Other Clinician: Referring Physician: Treating Physician/Extender: [name] in Treatment: 5 Eleanor, Keyshun C ([id number]) [id number] Page 6 of 8 Education Assessment Education Provided To: Patient Education Topics Provided Wound/Skin Impairment: Handouts: Caring for Your Ulcer Methods: Explain/Verbal Responses: State content correctly Electronic Signature(s) Signed: 05/05/2022 4:52:42 PM By: Rosalio Loud MSN RN CNS WTA Entered By: Rosalio Loud on 05/05/2022 15:09:44 -------------------------------------------------------------------------------- Wound Assessment Details Patient Name: Date of Service: [place], [name] 05/05/2022 2:15 PM Medical Record Number: [id number] Patient Account Number: [id number] Date of Birth/Sex: Treating RN: 25-Aug-1985 (37 y.o. Sew[place] Meth Primary Care Naya Ilagan: [name] Other Clinician: Referring Dasja Brase: Treating Storie Heffern/Extender: [name] in Treatment: 5 Wound Status Wound Number: 1 Primary Etiology: Pressure Ulcer Wound Location: Left, Midline Amputation Site - Below Knee Wound Status: Open Wounding Event: Gradually Appeared Date Acquired: 01/24/2022 Weeks Of Treatment: 5 Clustered Wound: No Photos Wound Measurements Length: (cm) 0.2 Width: (cm) 0.2 Depth: (cm) 0.2 Area: (cm) 0.031 Volume: (cm) 0.006 % Reduction in Area: 98.8% % Reduction in Volume: 99.9% Epithelialization: Medium (34-66%) Wound Description Classification: Category/Stage III Exudate Amount: Medium Exudate Type: Serosanguineous Schuenemann, Aayansh C ([id number]) Exudate Color: red, brown Foul Odor After Cleansing: No Slough/Fibrino No [phone].pdf Page 7 of 8 Wound Bed Granulation Amount: Large (67-100%) Exposed Structure Granulation Quality: Red, Friable Fascia Exposed: No Necrotic Amount: Small  (1-33%) Fat Layer (Subcutaneous Tissue) Exposed: Yes Tendon Exposed: No Muscle Exposed: No Joint Exposed: No Bone Exposed: No Treatment Notes 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 Use non-bordered dressing only. Secured With Compression Wrap Compression Stockings Environmental education officer) Signed: 05/05/2022 4:52:42 PM By: Rosalio Loud MSN RN CNS WTA Entered By: Rosalio Loud on 05/05/2022 15:17:51 -------------------------------------------------------------------------------- Vitals Details Patient Name: Date of Service: [name] 05/05/2022 2:15 PM Medical Record Number: [id number] Patient Account Number: [id number] Date of Birth/Sex: Treating RN: [birth year] (37 y.o. Seward Meth Primary Care Aydien Majette: [name] Other Clinician: Referring Madysin Crisp: Treating Rooney Swails/Extender: [name] in Treatment: 5 Vital Signs Time Taken: 02:35 Temperature (F): 97.7 Pulse (bpm): 85 Respiratory Rate (breaths/min): 16 Blood Pressure (mmHg): 135/82 Reference Range: 80 - 120 mg / dl Electronic Signature(s) Signed: 05/05/2022 4:52:42 PM By: Rosalio Loud MSN RN CNS WTA [place],Signed: 05/05/2022 4:52:42 PM By: Rosalio Loud MSN RN CNS Madaline Savage ([id number]) [phone].pdf Page 8 of 8 Entered By: Rosalio Loud on  05/05/2022 15:17:28 

## 2022-05-06 NOTE — Progress Notes (Addendum)
Jeffrey, Navarro ([name]) [id number] Page 1 of 7 Visit Report for 05/05/2022 Chief Complaint Document Details Patient Name: Date of Service: Jeffrey, Navarro [name] 05/05/2022 2:15 PM Medical Record Number: [name] Patient Account Number: [name] Date of Birth/Sex: Treating RN: [name] (Jeffrey y.o. [name] Primary Care Provider: Alma Navarro Other Clinician: Referring Provider: Treating Provider/Extender: [name] in Treatment: 5 Information Obtained from: Patient Chief Complaint Pressure ulcer left BKA location Electronic Signature(s) Signed: 05/05/2022 2:13:09 PM By: Jeffrey Keeler PA-C Entered By: [name] on 05/05/2022 14:13:09 -------------------------------------------------------------------------------- Debridement Details Patient Name: Date of Service: [name], [name] 05/05/2022 2:15 PM Medical Record Number: [name] Patient Account Number: [name] Date of Birth/Sex: Treating RN: [name] (Jeffrey y.o. [name] Primary Care Provider: Alma Navarro Other Clinician: Referring Provider: Treating Provider/Extender: [name] in Treatment: 5 Debridement Performed for Assessment: Wound #1 Left,Midline Amputation Site - Below Knee Performed By: Physician [name]., PA-C Debridement Type: Debridement Level of Consciousness (Pre-procedure): Awake and Alert Pre-procedure Verification/Time Out No Taken: Start Time: 15:06 T Area Debrided (L x W): otal 0.2 (cm) x 0.2 (cm) = 0.04 (cm) Tissue and other material debrided: Viable, Non-Viable, Slough, Subcutaneous, Skin: Dermis , Skin: Epidermis, Slough Level: Skin/Subcutaneous Tissue Debridement Description: Excisional Instrument: Curette Bleeding: Minimum Hemostasis Achieved: Pressure Response to Treatment: Procedure was tolerated well Level of Consciousness (Post- Awake and Alert procedure): Post  Debridement Measurements of Total Wound [name], [name] ([name]) [id number] Page 2 of 7 Length: (cm) 0.3 Stage: Category/Stage III Width: (cm) 0.3 Depth: (cm) 0.1 Volume: (cm) 0.007 Character of Wound/Ulcer Post Debridement: Stable Post Procedure Diagnosis Same as Pre-procedure Electronic Signature(s) Signed: 05/05/2022 4:52:42 PM By: Jeffrey Loud MSN RN CNS WTA Signed: 05/05/2022 5:46:15 PM By: Jeffrey Keeler PA-C Entered By: [name] on 05/05/2022 15:18:19 -------------------------------------------------------------------------------- HPI Details Patient Name: Date of Service: [name] 05/05/2022 2:15 PM Medical Record Number: [name] Patient Account Number: [name] Date of Birth/Sex: Treating RN: [name] (Jeffrey y.o. 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 He states this began somewhere around January 24, 2022. Subsequently he has been seen by his primary care provider who did refer him to [place] I believe and subsequently they placed him on antibiotics that was for doxycycline 14 days on December 29. 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 There has not been any recent x-rays and I do believe he needs to get an x-ray before next week's follow-up appointment. Nonetheless this does actually go a little bit deeper than what I initially was hoping for and again it  was obvious on the evaluation that something was not quite right. With that being said I do think that we can definitely start things as far as treatment is concerned and try to get this under control he does seem like this was more of a pressure and abrasion type issue in regard to his prosthesis and how it fits on his leg  he does have signs of some abrasion and skin changes due to this on the distal portion of his stump of the left leg which he tells me has been going on for quite some time. I think his prosthesis may not be fitting quite right. Patient has a history of spina bifida but otherwise really no other major medical problems other than what he is dealing with currently. 04-06-2022 upon evaluation today patient appears to be doing well with regard to his wound. He is actually making some signs of good improvement which is great news and overall I am extremely pleased with where we stand today. There does not appear to be any evidence of active infection locally nor systemically at this time which is great news. No fevers, chills, nausea, vomiting, or diarrhea. 04-13-2022 upon evaluation today patient appears to be doing well currently in regard to his wound. In fact it appears that this wound is significantly improved even compared to last time I saw him. I am very pleased in that regard I do believe that we should see about changing things up I do not believe the [facility] rope is going to continue to benefit him going forward but this is simply due to the fact honestly that I believe it is going to keep it more open as opposed to allow it to continue to close and. In general I do think that we are headed in the right direction which is great news. No fevers, chills, nausea, vomiting, or diarrhea. 04-20-2022 upon evaluation today patient's wound actually is showing signs of improvement although he does have some irritation external in regard to the wound. I do not see any signs of  infection I think he is doing quite well in that regard. 04-28-2022 upon evaluation today patient appears to be doing excellent in regard to the leg ulcer. He is tolerating the dressing changes without complication and the wound is much smaller than last week's evaluation is also not nearly as deep. 05-05-2022 upon evaluation today patient's wound is showing signs of healing fairly well at this time. I think that the wound is getting somewhat dry with the collagen there is really not much to packing at this point I think we may want to make a little bit of a change here using Xeroform to see if that could be of benefit for keeping this moist and allowing the new tissue to grow more appropriately. He voiced understanding and is in agreement with the plan. Electronic Signature(s) Signed: 05/05/2022 3:37:59 PM By: Jeffrey Keeler PA-C Entered By: [name] on 05/05/2022 15:37:59 Vaughan Basta ([id number]) [id number] Page 3 of 7 -------------------------------------------------------------------------------- Physical Exam Details Patient Name: Date of Service: OSCER, LESSARD 05/05/2022 2:15 PM Medical Record Number: [id number] Patient Account Number: [name] Date of Birth/Sex: Treating RN: [birth year] (37 y.o. [name] Primary Care Provider: Alma Navarro Other Clinician: Referring Provider: Treating Provider/Extender: [name] in Treatment: 5 Constitutional Well-nourished and well-hydrated in no acute distress. Respiratory normal breathing without difficulty. Psychiatric this patient is able to make decisions and demonstrates good insight into disease process. Alert and Oriented x 3. pleasant and cooperative. Notes Patient's wound bed actually showed signs of good granulation and epithelization at this point. Overall actually feel like that we are doing quite well currently and I am pleased with where things  stand. Electronic Signature(s) Signed: 05/05/2022 3:Jeffrey:13 PM By: Jeffrey Keeler PA-C Entered By: [name] on 05/05/2022 15:Jeffrey:13 --------------------------------------------------------------------------------  Physician Orders Details Patient Name: Date of Service: KAREM, CHADICK 05/05/2022 2:15 PM Medical Record Number: [id number] Patient Account Number: [name] Date of Birth/Sex: Treating RN: [birth year] (37 y.o. [name] Primary Care Provider: Alma Navarro Other Clinician: Referring Provider: Treating Provider/Extender: [name] in Treatment: 5 Verbal / Phone Orders: No Diagnosis Coding ICD-10 Coding Code Description [phone] Pressure ulcer of other site, stage 3 Z89.512 Acquired absence of left leg below knee Q05.9 Spina bifida, unspecified Follow-up Appointments Return Appointment in 1 week. [address] BLAIKE, PADUANO ([id number]) [id number] Page 4 of 7 May shower; gently cleanse wound with antibacterial soap, rinse and pat dry prior to dressing wounds Wound Treatment Wound #1 - Amputation Site - Below Knee Wound Laterality: Left, Midline Cleanser: Soap and Water Every Other Day/30 Days Discharge Instructions: Gently cleanse wound with antibacterial soap, rinse and pat dry prior to dressing wounds Prim Dressing: Xeroform-HBD 2x2 (in/in) Every Other Day/30 Days ary Discharge Instructions: Apply Xeroform-HBD 2x2 (in/in) as directed Secondary Dressing: (BORDER) Zetuvit Plus SILICONE BORDER Dressing 4x4 (in/in) Every Other Day/30 Days Discharge Instructions: Please do not put silicone bordered dressings under wraps. Use non-bordered dressing only. Electronic Signature(s) Signed: 05/05/2022 4:52:42 PM By: Jeffrey Loud MSN RN CNS WTA Signed: 05/05/2022 5:46:15 PM By: Jeffrey Keeler PA-C Entered By: [name] on 05/05/2022  15:18:37 -------------------------------------------------------------------------------- Problem List Details Patient Name: Date of Service: [name] 05/05/2022 2:15 PM Medical Record Number: [id number] Patient Account Number: [name] Date of Birth/Sex: Treating RN: Nav[birth year] (37 y.o. [name] Primary Care Provider: Alma Navarro Other Clinician: Referring Provider: Treating Provider/Extender: [name] in Treatment: 5 Active Problems ICD-10 Encounter Code Description Active Date MDM Diagnosis L89.893 Pressure ulcer of other site, stage 3 03/27/2022 No Yes Z89.512 Acquired absence of left leg below knee 03/27/2022 No Yes Q05.9 Spina bifida, unspecified 03/27/2022 No Yes Inactive Problems Resolved Problems Electronic Signature(s) Signed: 05/05/2022 2:13:04 PM By: Jeffrey Keeler PA-C Entered By: [name] on 05/05/2022 14:13:03 Baig, Harve C ([id number]) [id number] Page 5 of 7 -------------------------------------------------------------------------------- Progress Note Details Patient Name: Date of Service: CLAYBORN, HEYSE 05/05/2022 2:15 PM Medical Record Number: [id number] Patient Account Number: [name] Date of Birth/Sex: Treating RN: [birth year] (37 y.o. 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 He states this began somewhere around January 24, 2022. Subsequently he has been seen by his primary care provider who did refer  him to [place] I believe and subsequently they placed him on antibiotics that was for doxycycline 14 days on December 29. 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 There has not been any recent x-rays and I do believe he needs to get an x-ray before next week's follow- up appointment. Nonetheless this does actually go a little bit deeper than what I initially was hoping for and again it was obvious on the evaluation that something was  not quite right. With that being said I do think that we can definitely start things as far as treatment is concerned and try to get this under control he does seem like this was more of a pressure and abrasion type issue in regard to his prosthesis and how it fits on his leg he does have signs of some abrasion and skin changes due to this on the distal portion of his stump of the left leg which he tells me has been going on for quite some time. I think his prosthesis may not be fitting quite right. Patient has a history of spina bifida but otherwise really no other major medical problems other than what he is dealing with currently. 04-06-2022 upon evaluation today patient appears to be doing well with regard to his wound. He is actually making some signs of good improvement which is great news and overall I am extremely pleased with where we stand today. There does not appear to be any evidence of active infection locally nor systemically at this time which is great news. No fevers, chills, nausea, vomiting, or diarrhea. 04-13-2022 upon evaluation today patient appears to be doing well currently in regard to his wound. In fact it appears that this wound is significantly improved even compared to last time I saw him. I am very pleased in that regard I do believe that we should see about changing  things up I do not believe the [facility] rope is going to continue to benefit him going forward but this is simply due to the fact honestly that I believe it is going to keep it more open as opposed to allow it to continue to close and. In general I do think that we are headed in the right direction which is great news. No fevers, chills, nausea, vomiting, or diarrhea. 04-20-2022 upon evaluation today patient's wound actually is showing signs of improvement although he does have some irritation external in regard to the wound. I do not see any signs of infection I think he is doing quite well in that regard. 04-28-2022 upon evaluation today patient appears to be doing excellent in regard to the leg ulcer. He is tolerating the dressing changes without complication and the wound is much smaller than last week's evaluation is also not nearly as deep. 05-05-2022 upon evaluation today patient's wound is showing signs of healing fairly well at this time. I think that the wound is getting somewhat dry with the collagen there is really not much to packing at this point I think we may want to make a little bit of a change here using Xeroform to see if that could be of benefit for keeping this moist and allowing the new tissue to grow more appropriately. He voiced understanding and is in agreement with the plan. Objective Constitutional Well-nourished and well-hydrated in no acute distress. Vitals Time Taken: 2:35 AM, Temperature: 97.7 F, Pulse: 85 bpm, Respiratory Rate: 16 breaths/min, Blood Pressure: 135/82 mmHg. Respiratory normal breathing without difficulty. Psychiatric this patient is able to make decisions and demonstrates good insight into disease process. Alert and Oriented x 3. pleasant and cooperative. ARHAM, ECONOMIDES ([id number]) [id number] Page 6 of 7 General Notes: Patient's wound bed actually showed signs of good granulation and epithelization at this point.  Overall actually feel like that we are doing quite well currently and I am pleased with where things stand. Integumentary (Hair, Skin) Wound #1 status is Open. Original cause of wound was Gradually Appeared. The date  acquired was: 01/24/2022. The wound has been in treatment 5 weeks. The wound is located on the Left,Midline Amputation Site - Below Knee. The wound measures 0.2cm length x 0.2cm width x 0.2cm depth; 0.031cm^2 area and 0.006cm^3 volume. There is Fat Layer (Subcutaneous Tissue) exposed. There is a medium amount of serosanguineous drainage noted. There is large (67- 100%) red, friable granulation within the wound bed. There is a small (1-33%) amount of necrotic tissue within the wound bed. Assessment Active Problems ICD-10 Pressure ulcer of other site, stage 3 Acquired absence of left leg below knee Spina bifida, unspecified Procedures Wound #1 Pre-procedure diagnosis of Wound #1 is a Pressure Ulcer located on the Left,Midline Amputation Site - Below Knee . There was a Excisional Skin/Subcutaneous Tissue Debridement with a total area of 0.04 sq cm performed by [name]., PA-C. With the following instrument(s): Curette to remove Viable and Non- Viable tissue/material. Material removed includes Subcutaneous Tissue, Slough, Skin: Dermis, and Skin: Epidermis. No specimens were taken.A Minimum amount of bleeding was controlled with Pressure. The procedure was tolerated well. Post Debridement Measurements: 0.3cm length x 0.3cm width x 0.1cm depth; 0.007cm^3 volume. Post debridement Stage noted as Category/Stage III. Character of Wound/Ulcer Post Debridement is stable. Post procedure Diagnosis Wound #1: Same as Pre-Procedure Plan Follow-up Appointments: Return Appointment in 1 week. Bathing/ Shower/ Hygiene: May shower; gently cleanse wound with antibacterial soap, rinse and pat dry prior to dressing wounds WOUND #1: - Amputation Site - Below Knee Wound Laterality: Left,  Midline Cleanser: Soap and Water Every Other Day/30 Days Discharge Instructions: Gently cleanse wound with antibacterial soap, rinse and pat dry prior to dressing wounds Prim Dressing: Xeroform-HBD 2x2 (in/in) Every Other Day/30 Days ary Discharge Instructions: Apply Xeroform-HBD 2x2 (in/in) as directed Secondary Dressing: (BORDER) Zetuvit Plus SILICONE BORDER Dressing 4x4 (in/in) Every Other Day/30 Days Discharge Instructions: Please do not put silicone bordered dressings under wraps. Use non-bordered dressing only. 1. I am going to suggest that we have the patient go ahead and switch to Xeroform gauze dressing which I think skin to be a good option here. 2. Also can recommend that he should continue to cover this with the bordered foam dressing to cover. 3. I would also recommend that he continue to offload is much as possible I think this is still good to be of utmost importance. We will see patient back for reevaluation in 1 week here in the clinic. If anything worsens or changes patient will contact our office for additional recommendations. Electronic Signature(s) Signed: 05/05/2022 3:Jeffrey:45 PM By: Jeffrey Keeler PA-C Entered By: [name] on 05/05/2022 15:Jeffrey:45 Vaughan Basta ([id number]) [id number] Page 7 of 7 -------------------------------------------------------------------------------- SuperBill Details Patient Name: Date of Service: Renso, Micheau [name] 05/05/2022 Medical Record Number: [id number] Patient Account Number: [name] Date of Birth/Sex: Treating RN: [birth year] (37 y.o. [name] Primary Care Provider: Alma Navarro Other Clinician: Referring Provider: Treating Provider/Extender: [name] in Treatment: 5 Diagnosis Coding ICD-10 Codes Code Description [phone] Pressure ulcer of other site, stage 3 Z89.512 Acquired absence of left leg below knee Q05.9 Spina bifida, unspecified Facility  Procedures : CPT4 Code: [id number] Description: 11042 - DEB SUBQ TISSUE 20 SQ CM/< ICD-10 Diagnosis Description L89.893 Pressure ulcer of other site, stage 3 Modifier: Quantity: 1 Physician Procedures : CPT4 Code Description Modifier [id number] - WC PHYS SUBQ TISS 20 SQ CM ICD-10 Diagnosis Description L89.893 Pressure ulcer of other site, stage 3 Quantity: 1 Electronic Signature(s) Signed: 05/05/2022 3:Jeffrey:59 PM  By: Jeffrey Keeler PA-C Entered By: [name] on 05/05/2022 15:Jeffrey:59

## 2022-05-12 ENCOUNTER — Encounter: Payer: BC Managed Care – PPO | Admitting: Physician Assistant

## 2022-05-12 DIAGNOSIS — L89893 Pressure ulcer of other site, stage 3: Secondary | ICD-10-CM | POA: Diagnosis not present

## 2022-05-13 NOTE — Progress Notes (Signed)
MARSON, SALO ([id number]) [id number] Page 1 of 8 Visit Report for 05/12/2022 Arrival Information Details Patient Name: Date of Service: Feng, Devich [name] 05/12/2022 7:45 A M Medical Record Number: [id number] Patient Account Number: [id number] Date of Birth/Sex: Treating RN: 06-10-1985 (37 y.o. Seward Meth Primary Care Arelene Moroni: [name] Other Clinician: Referring Nazirah Tri: Treating Zackari Ruane/Extender: [name] in Treatment: 6 Visit Information History Since Last Visit Added or deleted any medications: No Patient Arrived: Crutches Has Dressing in Place as Prescribed: Yes Arrival Time: 07:52 Pain Present Now: No Accompanied By: self Transfer Assistance: None Patient Identification Verified: Yes Secondary Verification Process Completed: Yes Patient Requires Transmission-Based Precautions: No Patient Has Alerts: No Electronic Signature(s) Signed: 05/12/2022 3:12:00 PM By: Rosalio Loud MSN RN CNS WTA Entered By: Rosalio Loud on 05/12/2022 07:53:06 -------------------------------------------------------------------------------- Clinic Level of Care Assessment Details Patient Name: Date of Service: [place], [name] 05/12/2022 7:45 A M Medical Record Number: [id number] Patient Account Number: [id number] Date of Birth/Sex: Treating RN: Aug 13, 1985 (37 y.o. Seward Meth Primary Care Ailynn Gow: [name] Other Clinician: Referring Jenisis Harmsen: Treating Josemaria Brining/Extender: [name] in Treatment: 6 Clinic Level of Care Assessment Items TOOL 4 Quantity Score X- 1 0 Use when only an EandM is performed on FOLLOW-UP visit ASSESSMENTS - Nursing Assessment / Reassessment X- 1 10 Reassessment of Co-morbidities (includes updates in patient status) X- 1 5 Reassessment of Adherence to Treatment Plan ASSESSMENTS - Wound and Skin A ssessment / Reassessment X - Simple Wound Assessment /  Reassessment - one wound 1 5 '[]'$  - 0 Complex Wound Assessment / Reassessment - multiple wounds Papaleo, Vue C ([id number]) [phone].pdf Page 2 of 8 '[]'$  - 0 Dermatologic / Skin Assessment (not related to wound area) ASSESSMENTS - Focused Assessment '[]'$  - 0 Circumferential Edema Measurements - multi extremities '[]'$  - 0 Nutritional Assessment / Counseling / Intervention '[]'$  - 0 Lower Extremity Assessment (monofilament, tuning fork, pulses) '[]'$  - 0 Peripheral Arterial Disease Assessment (using hand held doppler) ASSESSMENTS - Ostomy and/or Continence Assessment and Care '[]'$  - 0 Incontinence Assessment and Management '[]'$  - 0 Ostomy Care Assessment and Management (repouching, etc.) PROCESS - Coordination of Care X - Simple Patient / Family Education for ongoing care 1 15 '[]'$  - 0 Complex (extensive) Patient / Family Education for ongoing care X- 1 10 Staff obtains Consents, Records, T Results / Process Orders est '[]'$  - 0 Staff telephones HHA, Nursing Homes / Clarify orders / etc '[]'$  - 0 Routine Transfer to another Facility (non-emergent condition) '[]'$  - 0 Routine Hospital Admission (non-emergent condition) '[]'$  - 0 New Admissions / Biomedical engineer / Ordering NPWT Apligraf, etc. , '[]'$  - 0 Emergency Hospital Admission (emergent condition) X- 1 10 Simple Discharge Coordination '[]'$  - 0 Complex (extensive) Discharge Coordination PROCESS - Special Needs '[]'$  - 0 Pediatric / Minor Patient Management '[]'$  - 0 Isolation Patient Management '[]'$  - 0 Hearing / Language / Visual special needs '[]'$  - 0 Assessment of Community assistance (transportation, D/C planning, etc.) '[]'$  - 0 Additional assistance / Altered mentation '[]'$  - 0 Support Surface(s) Assessment (bed, cushion, seat, etc.) INTERVENTIONS - Wound Cleansing / Measurement X - Simple Wound Cleansing - one wound 1 5 '[]'$  - 0 Complex Wound Cleansing - multiple wounds '[]'$  - 0 Wound Imaging (photographs - any number  of wounds) '[]'$  - 0 Wound Tracing (instead of photographs) '[]'$  - 0 Simple Wound Measurement - one wound '[]'$  - 0 Complex Wound Measurement - multiple wounds INTERVENTIONS - Wound  Dressings '[]'$  - 0 Small Wound Dressing one or multiple wounds '[]'$  - 0 Medium Wound Dressing one or multiple wounds '[]'$  - 0 Large Wound Dressing one or multiple wounds '[]'$  - 0 Application of Medications - topical '[]'$  - 0 Application of Medications - injection INTERVENTIONS - Miscellaneous '[]'$  - 0 External ear exam '[]'$  - 0 Specimen Collection (cultures, biopsies, blood, body fluids, etc.) '[]'$  - 0 Specimen(s) / Culture(s) sent or taken to Lab for analysis SHELTON, KRELL ([id number]) [phone].pdf Page 3 of 8 '[]'$  - 0 Patient Transfer (multiple staff / Civil Service fast streamer / Similar devices) '[]'$  - 0 Simple Staple / Suture removal (25 or less) '[]'$  - 0 Complex Staple / Suture removal (26 or more) '[]'$  - 0 Hypo / Hyperglycemic Management (close monitor of Blood Glucose) '[]'$  - 0 Ankle / Brachial Index (ABI) - do not check if billed separately X- 1 5 Vital Signs Has the patient been seen at the hospital within the last three years: Yes Total Score: 65 Level Of Care: New/Established - Level 2 Electronic Signature(s) Signed: 05/12/2022 3:12:00 PM By: Rosalio Loud MSN RN CNS WTA Entered By: Rosalio Loud on 05/12/2022 08:11:12 -------------------------------------------------------------------------------- Encounter Discharge Information Details Patient Name: Date of Service: [place]lim[place], [place] TTHEW C. 05/12/2022 7:45 A M Medical Record Number: [id number] Patient Account Number: [id number] Date of Birth/Sex: Treating RN: 1985-07-26 (37 y.o. Seward Meth Primary Care Tannen Vandezande: [name] Other Clinician: Referring Britteny Fiebelkorn: Treating Riyan Gavina/Extender: [name] in Treatment: 6 Encounter Discharge Information Items Discharge Condition: Stable Ambulatory Status:  Crutches Discharge Destination: Home Transportation: Private Auto Accompanied By: self Schedule Follow-up Appointment: No Clinical Summary of Care: Electronic Signature(s) Signed: 05/12/2022 3:12:00 PM By: Rosalio Loud MSN RN CNS WTA Entered By: Rosalio Loud on 05/12/2022 08:12:55 -------------------------------------------------------------------------------- Lower Extremity Assessment Details Patient Name: Date of Service: [place], [place] TTHEW C. 05/12/2022 7:45 A M Medical Record Number: [id number] Patient Account Number: [id number] Date of Birth/Sex: Treating RN: 01-09-1986 (37 y.o. Seward Meth Primary Care Kylan Veach: [name] Other Clinician: Referring Tristyn Demarest: Treating Aimar Borghi/Extender: Joanell Rising [place], [place] C ([id number]) [phone].pdf Page 4 of 8 Weeks in Treatment: 6 Electronic Signature(s) Signed: 05/12/2022 3:12:00 PM By: Rosalio Loud MSN RN CNS WTA Entered By: Rosalio Loud on 05/12/2022 08:08:07 -------------------------------------------------------------------------------- Multi Wound Chart Details Patient Name: Date of Service: Piano, MA TTHEW C. 05/12/2022 7:45 A M Medical Record Number: [id number] Patient Account Number: [id number] Date of Birth/Sex: Treating RN: 21-Jan-1986 (37 y.o. Seward Meth Primary Care Blaze Sandin: [name] Other Clinician: Referring Brylee Berk: Treating Layali Freund/Extender: [name] in Treatment: 6 Vital Signs Height(in): Pulse(bpm): 134 Weight(lbs): Blood Pressure(mmHg): 171/100 Body Mass Index(BMI): Temperature(F): 97.5 Respiratory Rate(breaths/min): 16 [1:Photos:] [N/A:N/A] Left, Midline Amputation Site - Below N/A N/A Wound Location: Knee Gradually Appeared N/A N/A Wounding Event: Pressure Ulcer N/A N/A Primary Etiology: 01/24/2022 N/A N/A Date Acquired: 6 N/A N/A Weeks of Treatment: Open N/A N/A Wound Status: No N/A N/A Wound  Recurrence: 0x0x0 N/A N/A Measurements L x W x D (cm) 0 N/A N/A A (cm) : rea 0 N/A N/A Volume (cm) : 100.00% N/A N/A % Reduction in A rea: 100.00% N/A N/A % Reduction in Volume: Category/Stage III N/A N/A Classification: Medium N/A N/A Exudate A mount: Serosanguineous N/A N/A Exudate Type: red, brown N/A N/A Exudate Color: Large (67-100%) N/A N/A Granulation A mount: Red, Friable N/A N/A Granulation Quality: Small (1-33%) N/A N/A Necrotic A mount: Fat Layer (Subcutaneous Tissue): Yes N/A N/A Exposed Structures: Fascia: No  Tendon: No Muscle: No Joint: No Bone: No Medium (34-66%) N/A N/A Epithelialization: Treatment Notes CACEY, BARTZ ([id number]) [phone].pdf Page 5 of 8 Electronic Signature(s) Signed: 05/12/2022 3:12:00 PM By: Rosalio Loud MSN RN CNS WTA Entered By: Rosalio Loud on 05/12/2022 08:08:14 -------------------------------------------------------------------------------- Multi-Disciplinary Care Plan Details Patient Name: Date of Service: [place], [name] 05/12/2022 7:45 A M Medical Record Number: [id number] Patient Account Number: [id number] Date of Birth/Sex: Treating RN: 1985/07/22 (6 y.o. Seward Meth Primary Care Tamel Abel: [name] Other Clinician: Referring Tkai Serfass: Treating Leolia Vinzant/Extender: [name] in Treatment: 6 Active Inactive Electronic Signature(s) Signed: 05/12/2022 3:12:00 PM By: Rosalio Loud MSN RN CNS WTA Entered By: Rosalio Loud on 05/12/2022 08:12:09 -------------------------------------------------------------------------------- Pain Assessment Details Patient Name: Date of Service: [place], MA TTHEW C. 05/12/2022 7:45 A M Medical Record Number: [id number] Patient Account Number: [id number] Date of Birth/Sex: Treating RN: 1985/07/06 (37 y.o. Seward Meth Primary Care Jamekia Gannett: [name] Other Clinician: Referring Athene Schuhmacher: Treating  Terrisha Lopata/Extender: [name] in Treatment: 6 Active Problems Location of Pain Severity and Description of Pain Patient Has Paino No Site Locations [place], [place] C ([id number]) [id number] Page 6 of 8 Pain Management and Medication Current Pain Management: Electronic Signature(s) Signed: 05/12/2022 3:12:00 PM By: Rosalio Loud MSN RN CNS WTA Entered By: Rosalio Loud on 05/12/2022 07:57:55 -------------------------------------------------------------------------------- Patient/Caregiver Education Details Patient Name: Date of Service: [name] 2/27/2024andnbsp7:45 [place] Record Number: [id number] Patient Account Number: [id number] Date of Birth/Gender: Treating RN: 08/20/1985 (37 y.o. Seward Meth Primary Care Physician: [name] Other Clinician: Referring Physician: Treating Physician/Extender: [name] in Treatment: 6 Education Assessment Education Provided To: Patient Education Topics Provided Wound/Skin Impairment: Handouts: Caring for Your Ulcer Methods: Explain/Verbal Responses: State content correctly Electronic Signature(s) Signed: 05/12/2022 3:12:00 PM By: Rosalio Loud MSN RN CNS WTA Entered By: Rosalio Loud on 05/12/2022 08:12:14 Vaughan Basta ([id number].pdf Page 7 of 8 -------------------------------------------------------------------------------- Wound Assessment Details Patient Name: Date of Service: RYLER, HEMPHILL 05/12/2022 7:45 A M Medical Record Number: [id number] Patient Account Number: [id number] Date of Birth/Sex: Treating RN: [birth year] (37 y.o. Seward Meth Primary Care Cashus Halterman: [name] Other Clinician: Referring Madyx Delfin: Treating Adamae Ricklefs/Extender: [name] in Treatment: 6 Wound Status Wound Number: 1 Primary Etiology: Pressure Ulcer Wound Location: Left,  Midline Amputation Site - Below Knee Wound Status: Open Wounding Event: Gradually Appeared Date Acquired: 01/24/2022 Weeks Of Treatment: 6 Clustered Wound: No Photos Wound Measurements Length: (cm) Width: (cm) Depth: (cm) Area: (cm) Volume: (cm) 0 % Reduction in Area: 100% 0 % Reduction in Volume: 100% 0 Epithelialization: Medium (34-66%) 0 0 Wound Description Classification: Category/Stage III Exudate Amount: Medium Exudate Type: Serosanguineous Exudate Color: red, brown Foul Odor After Cleansing: No Slough/Fibrino No Wound Bed Granulation Amount: Large (67-100%) Exposed Structure Granulation Quality: Red, Friable Fascia Exposed: No Necrotic Amount: Small (1-33%) Fat Layer (Subcutaneous Tissue) Exposed: Yes Tendon Exposed: No Muscle Exposed: No Joint Exposed: No Bone Exposed: No Electronic Signature(s) Signed: 05/12/2022 3:12:00 PM By: Rosalio Loud MSN RN CNS WTA Entered By: Rosalio Loud on 05/12/2022 08:07:15 Vaughan Basta ([id number]) [id number] Page 8 of 8 -------------------------------------------------------------------------------- Vitals Details Patient Name: Date of Service: Jack, C[name] 05/12/2022 7:45 A M Medical Record Number: [id number] Patient Account Number: [id number] Date of Birth/Sex: Treating RN: June 22, 1985 (37 y.o. Seward Meth Primary Care Rashika Bettes: [name] Other Clinician: Referring Kieren Ricci: Treating Adiana Smelcer/Extender: [name] in Treatment: 6 Vital  Signs Time Taken: 07:53 Temperature (F): 97.5 Pulse (bpm): 134 Respiratory Rate (breaths/min): 16 Blood Pressure (mmHg): 171/100 Reference Range: 80 - 120 mg / dl Electronic Signature(s) Signed: 05/12/2022 3:12:00 PM By: Rosalio Loud MSN RN CNS WTA Entered By: Rosalio Loud on 05/12/2022 07:57:47

## 2022-05-13 NOTE — Progress Notes (Addendum)
STPEHEN, TROJANOWSKI ([id number]) [id number] Page 1 of 6 Visit Report for 05/12/2022 Chief Complaint Document Details Patient Name: Date of Service: [name], [name] [name] 05/12/2022 7:45 A M Medical Record Number: [id number] Patient Account Number: [id number] Date of Birth/Sex: Treating RN: 05/01/1985 (37 y.o. Seward Meth Primary Care Provider: [name] Other Clinician: Referring Provider: Treating Provider/Extender: [name] in Treatment: 6 Information Obtained from: Patient Chief Complaint Pressure ulcer left BKA location Electronic Signature(s) Signed: 05/12/2022 8:04:37 AM By: Worthy Keeler PA-C Entered By: Worthy Keeler on 05/12/2022 08:04:37 -------------------------------------------------------------------------------- HPI Details Patient Name: Date of Service: [place], [name] 05/12/2022 7:45 A M Medical Record Number: [id number] Patient Account Number: [id number] Date of Birth/Sex: Treating RN: 06/19/1985 (37 y.o. 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 He states this began somewhere around January 24, 2022. Subsequently he has been seen by his primary care provider who did refer him to [place] I believe and subsequently they placed him on antibiotics that was for doxycycline 14 days on December 29. 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 There has not  been any recent x-rays and I do believe he needs to get an x-ray before next week's follow-up appointment. Nonetheless this does actually go a little bit deeper than what I initially was hoping for and again it was obvious on the evaluation that something was not quite right. With that being said I do think that we can definitely start things as far as treatment is concerned and try to get this under control he does seem like this was more of a pressure and abrasion type issue in regard to his prosthesis and how it fits on his leg he does have signs of some abrasion and skin changes due to this on the distal portion of his stump of the left leg which he tells me has been going on for quite some time. I think his prosthesis may not be fitting quite right. Patient has a history of spina bifida but otherwise really no other major medical problems other than what he is dealing with currently. 04-06-2022 upon evaluation today patient appears to be doing well with regard to his wound. He is actually making some signs of good improvement which is great news and overall I am extremely pleased with where we stand today. There does not appear to be any evidence of active infection locally nor systemically at this time which is great news. No fevers, chills, nausea, vomiting, or diarrhea. [name], [name] ([id number]) [id number] Page 2 of 6 04-13-2022 upon evaluation today patient appears to be doing well currently in regard to his wound. In fact it appears that this wound is significantly improved even compared to last time I saw him. I am very pleased in that regard I do believe that we should see about changing things up I do not believe the [facility] Blue rope is going to continue to benefit him going forward but this is simply  due to the fact honestly that I believe it is going to keep it more open as opposed to allow it to continue to close and. In general I do think that we are  headed in the right direction which is great news. No fevers, chills, nausea, vomiting, or diarrhea. 04-20-2022 upon evaluation today patient's wound actually is showing signs of improvement although he does have some irritation external in regard to the wound. I do not see any signs of infection I think he is doing quite well in that regard. 04-28-2022 upon evaluation today patient appears to be doing excellent in regard to the leg ulcer. He is tolerating the dressing changes without complication and the wound is much smaller than last week's evaluation is also not nearly as deep. 05-05-2022 upon evaluation today patient's wound is showing signs of healing fairly well at this time. I think that the wound is getting somewhat dry with the collagen there is really not much to packing at this point I think we may want to make a little bit of a change here using Xeroform to see if that could be of benefit for keeping this moist and allowing the new tissue to grow more appropriately. He voiced understanding and is in agreement with the plan. 05-12-2022 upon evaluation today patient appears to be doing excellent in regard to his wound. In fact this looks to be healed. I did tested to be sure with a curette and there is nothing underneath this is also news. Electronic Signature(s) Signed: 05/12/2022 8:09:08 AM By: Worthy Keeler PA-C Entered By: Worthy Keeler on 05/12/2022 08:09:08 -------------------------------------------------------------------------------- Physical Exam Details Patient Name: Date of Service: [place], [name] 05/12/2022 7:45 A M Medical Record Number: [id number] Patient Account Number: [id number] Date of Birth/Sex: Treating RN: Jan 13, 1986 (37 y.o. Seward Meth Primary Care Provider: [name] Other Clinician: Referring Provider: Treating Provider/Extender: [name] in Treatment: 6 Constitutional Well-nourished and well-hydrated in no acute  distress. Respiratory normal breathing without difficulty. Psychiatric this patient is able to make decisions and demonstrates good insight into disease process. Alert and Oriented x 3. pleasant and cooperative. Notes Upon inspection patient's wound bed actually showed signs of good granulation and epithelization at this point. Fortunately there does not appear to be any signs of active infection locally nor systemically which is great news and overall I am extremely pleased with where we stand today. Electronic Signature(s) Signed: 05/12/2022 8:09:34 AM By: Worthy Keeler PA-C Entered By: Worthy Keeler on 05/12/2022 08:09:34 Vaughan Basta ([id number]) [id number] Page 3 of 6 -------------------------------------------------------------------------------- Physician Orders Details Patient Name: Date of Service: [name], [name] 05/12/2022 7:45 A M Medical Record Number: [id number] Patient Account Number: [id number] Date of Birth/Sex: Treating RN: Dec 13, 1985 (37 y.o. Seward Meth Primary Care Provider: [name] Other Clinician: Referring Provider: Treating Provider/Extender: [name] in Treatment: 6 Verbal / Phone Orders: No Diagnosis Coding ICD-10 Coding Code Description [phone] Pressure ulcer of other site, stage 3 Z89.512 Acquired absence of left leg below knee Q05.9 Spina bifida, unspecified Discharge From [facility] Discharge from [facility] Complete Follow-up Appointments Other: - Call if needed Electronic Signature(s) Signed: 05/12/2022 3:12:00 PM By: Rosalio Loud MSN RN CNS WTA Signed: 05/14/2022 5:15:32 PM By: Worthy Keeler PA-C Entered By: Rosalio Loud on 05/12/2022 08:09:26 -------------------------------------------------------------------------------- Problem List Details Patient Name: Date of Service: [name] 05/12/2022 7:45 A M Medical Record Number:  [id number] Patient  Account Number: [id number] Date of Birth/Sex: Treating RN: 03-30-1985 (37 y.o. Seward Meth Primary Care Provider: [name] Other Clinician: Referring Provider: Treating Provider/Extender: [name] in Treatment: 6 Active Problems ICD-10 Encounter Code Description Active Date MDM Diagnosis [phone] Pressure ulcer of other site, stage 3 03/27/2022 No Yes Z89.512 Acquired absence of left leg below knee 03/27/2022 No Yes Q05.9 Spina bifida, unspecified 03/27/2022 No Yes Inactive Problems Resolved Problems [name], [name] ([id number]) [id number] Page 4 of 6 Electronic Signature(s) Signed: 05/12/2022 8:04:35 AM By: Worthy Keeler PA-C Entered By: Worthy Keeler on 05/12/2022 08:04:34 -------------------------------------------------------------------------------- Progress Note Details Patient Name: Date of Service: [name], [name] 05/12/2022 7:45 A M Medical Record Number: [id number] Patient Account Number: [id number] Date of Birth/Sex: Treating RN: 12-10-1985 (37 y.o. 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 He states this began somewhere around January 24, 2022. Subsequently he has been seen by his primary care provider who did refer him to [place] I believe and subsequently they placed him on antibiotics that was for doxycycline 14 days on December 29. I did review his culture results from that time as well and it showed that he did have  MRSA and the doxycycline was an appropriate treatment for that so he obviously seems to be doing better at this point which is great news. There has not been any recent x-rays and I do believe he needs to get an x-ray before next week's follow- up appointment. Nonetheless this does actually go a little bit deeper than what I initially was hoping for and again it was obvious on the evaluation that something was not quite right. With that being said I do think that we can definitely start things as far as treatment is concerned and try to get this under control he does seem like this was more of a pressure and abrasion type issue in regard to his prosthesis and how it fits on his leg he does have signs of some abrasion and skin changes due to this on the distal portion of his stump of the left leg which he tells me has been going on for quite some time. I think his prosthesis may not be fitting quite right. Patient has a history of spina bifida but otherwise really no other major medical problems other than what he is dealing with currently. 04-06-2022 upon evaluation today patient appears to be doing well with regard to his wound. He is actually making some signs of good improvement which is great news and overall I am extremely pleased with where we stand today. There does not appear to be any evidence of active infection locally nor systemically at this time which is great news. No fevers, chills, nausea, vomiting, or diarrhea. 04-13-2022 upon evaluation today patient appears to be doing well currently in regard to his wound. In fact it appears that this wound is significantly improved even compared to last time I saw him. I am very pleased in that regard I do believe that we should see about changing things up I do not believe the [employer] Blue rope is going to  continue to benefit him going forward but this is simply due to the fact honestly that I believe it is going to keep it more open as opposed  to allow it to continue to close and. In general I do think that we are headed in the right direction which is great news. No fevers, chills, nausea, vomiting, or diarrhea. 04-20-2022 upon evaluation today patient's wound actually is showing signs of improvement although he does have some irritation external in regard to the wound. I do not see any signs of infection I think he is doing quite well in that regard. 04-28-2022 upon evaluation today patient appears to be doing excellent in regard to the leg ulcer. He is tolerating the dressing changes without complication and the wound is much smaller than last week's evaluation is also not nearly as deep. 05-05-2022 upon evaluation today patient's wound is showing signs of healing fairly well at this time. I think that the wound is getting somewhat dry with the collagen there is really not much to packing at this point I think we may want to make a little bit of a change here using Xeroform to see if that could be of benefit for keeping this moist and allowing the new tissue to grow more appropriately. He voiced understanding and is in agreement with the plan. 05-12-2022 upon evaluation today patient appears to be doing excellent in regard to his wound. In fact this looks to be healed. I did tested to be sure with a curette and there is nothing underneath this is also news. Objective Constitutional JOHNMATTHEW, [name] ([id number]) [phone].pdf Page 5 of 6 Well-nourished and well-hydrated in no acute distress. Vitals Time Taken: 7:53 AM, Temperature: 97.5 F, Pulse: 134 bpm, Respiratory Rate: 16 breaths/min, Blood Pressure: 171/100 mmHg. Respiratory normal breathing without difficulty. Psychiatric this patient is able to make decisions and demonstrates good insight into disease process. Alert and Oriented x 3. pleasant and cooperative. General Notes: Upon inspection patient's wound bed actually showed signs of good granulation  and epithelization at this point. Fortunately there does not appear to be any signs of active infection locally nor systemically which is great news and overall I am extremely pleased with where we stand today. Integumentary (Hair, Skin) Wound #1 status is Open. Original cause of wound was Gradually Appeared. The date acquired was: 01/24/2022. The wound has been in treatment 6 weeks. The wound is located on the Left,Midline Amputation Site - Below Knee. The wound measures 0cm length x 0cm width x 0cm depth; 0cm^2 area and 0cm^3 volume. There is Fat Layer (Subcutaneous Tissue) exposed. There is a medium amount of serosanguineous drainage noted. There is large (67-100%) red, friable granulation within the wound bed. There is a small (1-33%) amount of necrotic tissue within the wound bed. Assessment Active Problems ICD-10 Pressure ulcer of other site, stage 3 Acquired absence of left leg below knee Spina bifida, unspecified Plan Discharge From [facility] Services: Discharge from [facility] Complete Follow-up Appointments: Other: - Call if needed 1. Based on what I am seeing I do believe that the patient is completely healed which is great news. Overall I am extremely pleased with where things stand. 2. I am going to suggest as well that he continue to monitor for any signs of infection or worsening. Obviously if anything changes he knows contact the office and let me know. We will see him back for follow-up visit as needed. Electronic Signature(s) Signed: 05/12/2022 8:17:52 AM By: Worthy Keeler  PA-C Entered By: Worthy Keeler on 05/12/2022 08:17:52 -------------------------------------------------------------------------------- SuperBill Details Patient Name: Date of Service: [place], [name] [name] 05/12/2022 Medical Record Number: [id number] Patient Account Number: [id number] Date of Birth/Sex: Treating RN: Jun 23, 1985 (37 y.o. Seward Meth Primary Care Provider: [name] Other Clinician: Referring Provider: Treating Provider/Extender: [name] in Treatment: [address] [place] ([id number]) [id number] Page 6 of 6 Diagnosis Coding ICD-10 Codes Code Description [phone] Pressure ulcer of other site, stage 3 Z89.512 Acquired absence of left leg below knee Q05.9 Spina bifida, unspecified Facility Procedures : CPT4 Code: [id number] Description: [phone] - WOUND CARE VISIT-LEV 2 EST PT Modifier: Quantity: 1 Physician Procedures : CPT4 Code Description Modifier [id number] - WC PHYS LEVEL 3 - EST PT ICD-10 Diagnosis Description L89.893 Pressure ulcer of other site, stage 3 Z89.512 Acquired absence of left leg below knee Q05.9 Spina bifida, unspecified Quantity: 1 Electronic Signature(s) Signed: 05/12/2022 8:20:49 AM By: Worthy Keeler PA-C Entered By: Worthy Keeler on 05/12/2022 08:20:49

## 2023-02-18 DIAGNOSIS — Q052 Lumbar spina bifida with hydrocephalus: Secondary | ICD-10-CM | POA: Diagnosis not present

## 2023-02-18 DIAGNOSIS — Z89512 Acquired absence of left leg below knee: Secondary | ICD-10-CM | POA: Diagnosis not present

## 2023-04-08 DIAGNOSIS — G809 Cerebral palsy, unspecified: Secondary | ICD-10-CM | POA: Diagnosis not present

## 2023-04-08 DIAGNOSIS — M25371 Other instability, right ankle: Secondary | ICD-10-CM | POA: Diagnosis not present

## 2023-10-12 DIAGNOSIS — R7401 Elevation of levels of liver transaminase levels: Secondary | ICD-10-CM | POA: Diagnosis not present

## 2023-10-12 DIAGNOSIS — E872 Acidosis, unspecified: Secondary | ICD-10-CM | POA: Diagnosis not present

## 2023-10-12 DIAGNOSIS — Z89512 Acquired absence of left leg below knee: Secondary | ICD-10-CM | POA: Diagnosis not present

## 2023-10-12 DIAGNOSIS — R7982 Elevated C-reactive protein (CRP): Secondary | ICD-10-CM | POA: Diagnosis not present

## 2023-10-12 DIAGNOSIS — T8789 Other complications of amputation stump: Secondary | ICD-10-CM | POA: Diagnosis not present

## 2023-10-12 DIAGNOSIS — R000 Tachycardia, unspecified: Secondary | ICD-10-CM | POA: Diagnosis not present

## 2023-10-12 DIAGNOSIS — M1712 Unilateral primary osteoarthritis, left knee: Secondary | ICD-10-CM | POA: Diagnosis not present

## 2023-10-12 DIAGNOSIS — T8744 Infection of amputation stump, left lower extremity: Secondary | ICD-10-CM | POA: Diagnosis not present

## 2023-10-12 DIAGNOSIS — R700 Elevated erythrocyte sedimentation rate: Secondary | ICD-10-CM | POA: Diagnosis not present

## 2023-10-13 DIAGNOSIS — M1712 Unilateral primary osteoarthritis, left knee: Secondary | ICD-10-CM | POA: Diagnosis not present

## 2023-10-13 DIAGNOSIS — M86462 Chronic osteomyelitis with draining sinus, left tibia and fibula: Secondary | ICD-10-CM | POA: Diagnosis not present

## 2023-10-13 DIAGNOSIS — G8918 Other acute postprocedural pain: Secondary | ICD-10-CM | POA: Diagnosis not present

## 2023-10-13 DIAGNOSIS — R030 Elevated blood-pressure reading, without diagnosis of hypertension: Secondary | ICD-10-CM | POA: Diagnosis not present

## 2023-10-13 DIAGNOSIS — R7982 Elevated C-reactive protein (CRP): Secondary | ICD-10-CM | POA: Diagnosis not present

## 2023-10-13 DIAGNOSIS — B9689 Other specified bacterial agents as the cause of diseases classified elsewhere: Secondary | ICD-10-CM | POA: Diagnosis not present

## 2023-10-13 DIAGNOSIS — Z86711 Personal history of pulmonary embolism: Secondary | ICD-10-CM | POA: Diagnosis not present

## 2023-10-13 DIAGNOSIS — R700 Elevated erythrocyte sedimentation rate: Secondary | ICD-10-CM | POA: Diagnosis not present

## 2023-10-13 DIAGNOSIS — R7401 Elevation of levels of liver transaminase levels: Secondary | ICD-10-CM | POA: Diagnosis not present

## 2023-10-13 DIAGNOSIS — B9561 Methicillin susceptible Staphylococcus aureus infection as the cause of diseases classified elsewhere: Secondary | ICD-10-CM | POA: Diagnosis not present

## 2023-10-13 DIAGNOSIS — Z89512 Acquired absence of left leg below knee: Secondary | ICD-10-CM | POA: Diagnosis not present

## 2023-10-13 DIAGNOSIS — L02416 Cutaneous abscess of left lower limb: Secondary | ICD-10-CM | POA: Diagnosis not present

## 2023-10-13 DIAGNOSIS — Z982 Presence of cerebrospinal fluid drainage device: Secondary | ICD-10-CM | POA: Diagnosis not present

## 2023-10-13 DIAGNOSIS — A4901 Methicillin susceptible Staphylococcus aureus infection, unspecified site: Secondary | ICD-10-CM | POA: Diagnosis not present

## 2023-10-13 DIAGNOSIS — Q054 Unspecified spina bifida with hydrocephalus: Secondary | ICD-10-CM | POA: Diagnosis not present

## 2023-10-13 DIAGNOSIS — E872 Acidosis, unspecified: Secondary | ICD-10-CM | POA: Diagnosis not present

## 2023-10-13 DIAGNOSIS — T8744 Infection of amputation stump, left lower extremity: Secondary | ICD-10-CM | POA: Diagnosis not present

## 2023-10-13 DIAGNOSIS — E8809 Other disorders of plasma-protein metabolism, not elsewhere classified: Secondary | ICD-10-CM | POA: Diagnosis not present

## 2023-10-13 DIAGNOSIS — Z882 Allergy status to sulfonamides status: Secondary | ICD-10-CM | POA: Diagnosis not present

## 2023-10-13 DIAGNOSIS — T8789 Other complications of amputation stump: Secondary | ICD-10-CM | POA: Diagnosis not present

## 2023-10-13 DIAGNOSIS — R000 Tachycardia, unspecified: Secondary | ICD-10-CM | POA: Diagnosis not present

## 2023-10-14 NOTE — Discharge Summary (Signed)
 [facility] Medicine Discharge Summary  Admit Date: 10/12/2023 Discharge Date: 10/19/2023  Admitting Physician: Dickey Fruits, MD Discharge Physician: Axel Peri Pries, MD  Primary Care Provider: Clarence Elza BRAVO, MD, Phone [phone]  Discharge Destination: Home with home infusion for IV abx.   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 *  Primary Diagnosis: Admitted for infected left BKA   Changes Made (with rationale):  PICC placed for IV abx.  Started IV cefazolin  2g q8 hrs + PO metronidazole 500 mg BID (EOT 11/25/2023).   To-Do List (incidental findings, follow-up studies, etc.): Weekly CBC with diff, CMP (every Monday) while on IV abx. HH RN to remove Prevana wound vac on 8/7 (POD7) and replace with Aquacel dressing.  Aquacel dressing to remain in place on ortho follow up on 8/18.  Anticipatory Guidance for Outpatient Care:  Ambulatory referral to PCP for video visit placed.     Results Pending at Discharge:  None Please see phone numbers at end of this summary for lab contact information.   Follow-up/Care Transition Plan: Sched. appts: Future Appointments  Date Time Provider Department Center  10/28/2023  8:30 AM McDonough, Geofm Gunner, MD [facility] [place]  11/01/2023  2:00 PM Dye, Delon Gear, PA [facility]          Allergies/Intolerances:  Allergies  Allergen Reactions  . Latex Unknown  . Sulfa (Sulfonamide Antibiotics) Palpitations     New Adverse Drug Events:  none  Medications:     Current Discharge Medication List     START taking these medications      Instructions  ceFAZolin  2 gram/50 mL IVPB Refills: 0  Commonly known as: ANCEF  Inject 50 mLs (2 g total) into the vein every 8 (eight) hours for 37 days Last time this was given: 2 g on October 18, 2023 11:11 PM   metroNIDAZOLE 500 MG tablet Quantity: 74 tablet Refills: 0  Commonly known as: FLAGYL Take 1 tablet (500 mg total) by mouth every 12 (twelve) hours for 37 days Last time this was given: 500 mg on October 19, 2023  8:41 AM         Brief History of Present Illness: per H&P:   [name] is a 38 y.o. male with a history of spina bifida with hydrocephalus status post VP shunt, left BKA 2018 due to infection with postoperative course complicated by PE.   Patient presented due to worsening left BKA wound over the past 3-4 days, now with purulent drainage.   In the ED temp 98.4, heart rate 131, respirations 18, BP 159/100, 100% on room air.  Patient was evaluated by Ortho with plans for debridement.  CT left lower extremity prelim showed peripherally enhancing ill-defined air-fluid collection extending from the distal amputation site of the tibia and the overlying skin surface where there is an open wound, diffusely mottled appearance of the distal tibia, findings are compatible with abscess with underlying osteomyelitis.  CRP 30.67, WBC 11.5.  ESR 76.  Initially, Ortho recommended holding off on antibiotics, however later determined that patient needs broad-spectrum antibiotics, vancomycin , Zosyn , started in the ED. Lactate 4.6  to 1.1 with 2.5 L of IV fluid.   Patient is accompanied by his wife at bedside.  Patient denies any pain or fevers or chills at home.  He has reduced sensation of the lower extremities.  States he does not have any other wounds.  Denies any chest pain, shortness of breath, abdominal pain, nausea, vomiting.  Denies any bleeding.  Does not take any  medications at home _____________________   Hospital Course by Problem:   #Left BKA stump abscess and osteomyelitis Presented due to worsening left BKA wound with evidence of purulent drainage.  CT LLE demonstrated fluid collection centered around amputation stump with skin tract c/f abscess and osteomyelitis.  Orthopedics and ID were consulted.  The patient received Vanc/Zosn in the ED but then antibiotics held until he went to the OR with Ortho on 7/31 for a washout/revision of his BKA.  BCx collected on admission, remained without growth. He was then resumed on Vanc/Zosyn  postop pending intra-op cultures.  Cultures +staph aureus and he was transitioned to IV cefazolin  2g q 8 hrs + PO metronidazole 500 mg BID x6 weeks from washout (EOT 9/11) per ID recommendations.  PICC placed for IV cefazolin  and HH infusion arranged.  Prevana wound vac placed post op, HH RN arranged to remove on 8/7 (POD7) to aquacel dressing; this plan confirmed with ortho.  He remains NWB to his LLE and will f/u with Ortho on 8/18.  He has outpatient ID follow up on 8/18.    #Prior history of left BKA in 2018 complicated by PE The patient was given LMWH for vte ppx while admitted, this was held on the day of surgery but then resumed post-op. He is not on a blood thinner at home.    #LFTs AST 57, ALT 62, previously slightly elevated in 2018 as well. Improved on recheck.  Monitor CMP weekly while on abx.  #Sinus tachycardia  HR 120s on admission, improved to 100-110s (asymptomatic) with treatment of infection.      Surgeries and Procedures Performed:   Procedure(s): AMPUTATION, LEG, THROUGH TIBIA AND FIBULA; SECONDARY CLOSURE OR SCAR REVISION DEBRIDEMENT, LEG; MUSCLE AND/OR FASCIA (INCLUDES EPIDERMIS, DERMIS, AND SUBCUTANEOUS TISSUE, IF PERFORMED); FIRST 20 SQ CM OR LESS _____________________  Discharge Exam:  BP (!) 150/93 (BP Location: Right upper arm, Patient Position: Lying)   Pulse (!) 111   Temp 37 C (98.6  F) (Oral)   Resp 20   Ht 170.2 cm (5' 7)   Wt 91.9 kg (202 lb 9.6 oz)   SpO2 100%   BMI 31.73 kg/m     General: alert, cooperative, in NAD Eyes: conjunctiva clear, anicteric sclera HENT: oropharynx clear, moist mucous membranes Neck:no adenopathy, no JVD, supple, symmetrical, trachea midline, and no thyromegally CV: regular rate and rhythm, without murmurs, rubs or gallops Resp: clear to auscultation, good air exchange Jai:dnqu, nontender, nondistended, normoactive bowel sounds  Rectal: deferred Ext: no lower extremity edema, LLE s/p BKA, dressing intact; knee immobilizer in place. Skin: no rashes or lesions Psych: oriented to time, place and person, mood and affect are approprate Neuro: CN II-XII intact Grossly normal and symmetric strength in upper and lower extremities Intact sensation to light touch throughout normal coordination  Pertinent Lab Testing: Recent Labs  Lab 10/15/23 0513 10/16/23 0507 10/18/23 0927  NA 137 138 138  K 3.5 3.9 3.9  CL 106 105 104  CO2 22 23 25   BUN 14 8 13   CREATININE 0.9 0.8 0.8  GLUCOSE 103 98 135  CALCIUM 8.0* 8.5* 9.0   Recent Labs  Lab 10/13/23 0637 10/15/23 0513 10/18/23 0927  AST 56* 32 34  ALT 51* 53* 55*  ALKPHOS 87 78 82  TBILI 1.7* 0.4 0.5    Recent Labs  Lab 10/15/23 0513 10/16/23 0507 10/18/23 0927  WBC 10.8* 9.8 11.9*  HGB 12.3* 12.0* 13.4*  HCT 37.8* 37.1* 40.8  PLT 322 306 324   Recent Labs  Lab 10/12/23 1635  APTT 31.8  INR 1.4*     Other Pertinent Labs:   Culture, Tissue (Aerobic and Anaerobic) w Gram Stain Order: [id number]  Status: Final result     Next appt: 10/28/2023 at 08:30 AM in Infectious Diseases (MOLLY CY PRO, MD)     Dx: Infection   Test Result Released: Yes (seen)   Specimen Information: Tissue  Culture Tissue 1+ Staphylococcus aureus Abnormal   Rare Growth Streptococcus agalactiae, group B Abnormal         Gram Stain Report Rare Gram positive cocci  Rare White  blood cells       Resulting Agency: [facility]   Susceptibility   Staphylococcus aureus    MIC   Clindamycin <=0.5 mcg/mL S   Daptomycin <=1 mcg/mL S   Doxycycline <=0.5 mcg/mL S   Erythromycin <=0.5 mcg/mL S   Linezolid 2 mcg/mL S   Oxacillin <=0.25 mcg/mL S 1   Trimethoprim + Sulfamethoxazole <=1/19 mcg/mL S   Vancomycin  1 mcg/mL S        Micro:  Lab Results  Component Value Date   BLDCULT No growth detected. 10/12/2023   BLDCULT No growth detected. 10/12/2023     Mycobacteria and fungal culture 7/31: NGTD  Pertinent Imaging:   X-ray knee left 1 to 2 views portable Result Date: 10/15/2023 XR KNEE LEFT 1 TO 2 VIEWS PORTABLE Number of views: 2. INDICATION:  postop, T87.44 Infection of amputation stump, left lower extremity (CMS/HHS-HCC). COMPARISON: CT left tibia/fibula and left knee radiographs dated September 22, 2023 FINDINGS/IMPRESSION: Bones: Status post below-the-knee amputation. Decreased ossification along the anterior tibial amputation margin, presumably related to interval debridement. Minimal heterotopic ossification along the inferior posterior margin. Otherwise no progressive destructive osseous changes. Alignment: No dislocation. Joint spaces: Similar mild degenerative changes in the left knee. Soft tissues: Soft tissue swelling about the amputation stump with small amounts of soft tissue gas at the anterior aspect, presumably postoperative. Overlying wound VAC device. Electronically Signed by:  Dorise Flatten, MD, [facility] Electronically Signed on:  10/15/2023 10:19 AM  CT tibia fibula left with contrast Result Date: 10/13/2023 CT TIBIA FIBULA LEFT WITH CONTRAST Indication: purulent wound from L BKA stump site, eval depth of infection, eval for abscess, eval for osteo, T87.44 Infection of amputation stump, left lower extremity (CMS/HHS-HCC). Comparison: None. Technique: Postcontrast axial images of the left tibia and fibula were acquired.  Sagittal and coronal reformatted  images were created from the data, and this examination was interpreted using these multiplanar reconstructions. Findings: Hardware: None. Alignment: Anatomic. Bones: No acute fracture. Sequela of below knee amputation with multiple ossific densities about the tibial and fibular surgical margins, favored postsurgical or reflective of heterotopic ossification. Cortical indistinctness of the surgical margin of the tibia with abutting soft tissue density (6:107-110). Joint spaces: Preserved minimal osteophytosis of the lateral patellofemoral compartments. Subtle lucency involving the weightbearing articular lateral femoral condyle (6:73). Small knee effusion. Soft tissues: Soft tissue stranding and cutaneous thickening centered about the amputation stump with peripherally enhancing collection measuring approximately 3.1 x 3.6 x 4.4  cm (5:132, 6:106). The collection includes multiple foci of gas, which may be iatrogenic, and extends to the inferior cutaneous surface. Soft tissue density extends around the osseous surgical margin of the tibia. Fatty replacement of the lower leg muscles. Impression: 1.  Peripherally enhancing collection centered about the amputation stump with skin tract, worrisome for abscess. Associated findings suspicious for osteomyelitis of the adjacent tibial surgical margin. 2.  Suspect lateral femoral condyle osteochondral lesion. Findings were discussed with Damien Pizza MD by Severa CORDOBA Ester, MD at 10/13/2023 12:08 AM. The preliminary report (critical or emergent communication) was reviewed prior to this dictation and there are no substantial differences between the preliminary results and the impressions in this final report. Electronically Reviewed by:  Delon LITTIE Sharps, MD, [facility] Electronically Reviewed on:  10/13/2023 10:17 AM I have reviewed the images and concur with the above findings. Electronically Signed by:  Gaither Browns, MD, [facility] Electronically Signed on:  10/13/2023  11:12 AM  X-ray knee left 1 to 2 views Result Date: 10/12/2023 XR KNEE LEFT 1 TO 2 VIEWS (VIEWS: 2) Indication:  WOUND CHECK- L BKA has wound, Z89.512 Acquired absence of left leg below knee (CMS/HHS-HCC). Comparison: None. Findings/Impression: Status post left below-knee amputation. Skin irregularity/ulceration with subcutaneous gas adjacent to the amputation margin with increased osseous erosion of the distal tibial stump concerning for osteomyelitis. Mild left knee degenerative changes. No dislocation. Electronically Reviewed by:  Channie Prior, MD, [facility] Electronically Reviewed on:  10/12/2023 7:42 PM I have reviewed the images and concur with the above findings. Electronically Signed by:  Gatha Daria Herb, MD, [facility] Electronically Signed on:  10/12/2023 8:06 PM  _____________________  Code Status: Full Code Goals of care were not addressed during this admission.   Status on Discharge:  Current activity: Walks occasionally (10/19/23 0929) Current mobility: No limitation (10/19/23 0929)  Activity Recommendation: activity as tolerated  Other Discharge Instructions: Services setup at discharge: Home infusion for IV cefazolin  Tubes/lines at discharge: PICC line  Diet: Diet regular  Wound Care Order Instructions             Wound Care  Daily at 0900       Question:  Body Site  Answer:  Right great toe: paint with betadine daily, leave open to air.      Wound Care  2 times daily       Comments: To left buttock wound: clean with normal saline, pad dry and then Apply No Sting Skin Barrier Film ([id number]#[id number]; SAP# [id number]) over the wound. Then apply moisture barrier paste (Critc-Aid Thick- SAP # [id number]) to left buttock wound.  Apply BID and PRN if soiled.      Wound Care  Every Shift       Comments: Implement and continue Pressure Injury Plan of Care per Pressure Injury Prevention Policy and/or Unit Pressure Injury Prevention Bundle Q shift. Apply protective  padding to bilateral heels, inspect every shift and notify WC if any new concerns arise.   Offload heels by placing a pillow lengthwise under each calf so that the heels are floating and not in contact with the pillow or bed surface              _____________________  Time spent on discharge process: 45 minutes    ODELLA ANETTE MOHAIR, PA [facility]  10/19/2023   Hospital Contact Information:  [place] [facility]) [facility])  Pending tests:  Laboratory: [phone] Microbiology: [phone]  Pathology: [phone] Radiology: [phone]  General questions: [phone] Pending tests: Laboratory: [phone] Microbiology: [phone] Pathology: [phone] Radiology: [phone]  General questions:  [phone] Pending tests:  Laboratory: [phone] Microbiology: [phone] Pathology: [phone] Radiology: [phone]  General questions:  [phone]

## 2023-10-19 DIAGNOSIS — Z89512 Acquired absence of left leg below knee: Secondary | ICD-10-CM | POA: Diagnosis not present

## 2023-10-19 DIAGNOSIS — A4901 Methicillin susceptible Staphylococcus aureus infection, unspecified site: Secondary | ICD-10-CM | POA: Diagnosis not present

## 2023-10-21 ENCOUNTER — Telehealth: Payer: Self-pay

## 2023-10-21 DIAGNOSIS — L989 Disorder of the skin and subcutaneous tissue, unspecified: Secondary | ICD-10-CM | POA: Diagnosis not present

## 2023-10-21 DIAGNOSIS — M869 Osteomyelitis, unspecified: Secondary | ICD-10-CM | POA: Diagnosis not present

## 2023-10-21 DIAGNOSIS — Z89512 Acquired absence of left leg below knee: Secondary | ICD-10-CM | POA: Diagnosis not present

## 2023-10-21 DIAGNOSIS — B9561 Methicillin susceptible Staphylococcus aureus infection as the cause of diseases classified elsewhere: Secondary | ICD-10-CM | POA: Diagnosis not present

## 2023-10-21 DIAGNOSIS — T8744 Infection of amputation stump, left lower extremity: Secondary | ICD-10-CM | POA: Diagnosis not present

## 2023-10-21 DIAGNOSIS — A4901 Methicillin susceptible Staphylococcus aureus infection, unspecified site: Secondary | ICD-10-CM | POA: Diagnosis not present

## 2023-10-21 DIAGNOSIS — T8781 Dehiscence of amputation stump: Secondary | ICD-10-CM | POA: Diagnosis not present

## 2023-10-21 NOTE — Telephone Encounter (Signed)
 Copied from CRM [id number]. Topic: Clinical - Medical Advice >> Oct 21, 2023  2:43 PM Taleah C wrote: Reason for CRM: pt's mother, Michaele, called and stated that the patient was discharged from [facility] for a bone infection and he had surgery to clean the infection. She explained that he has home care nursing and they need to do a dressing change but [facility] did not provide him with the correct him with any dressing. She is wanting to know if  [facility] could assist them with helping him get the Aquacel Surgical dressing that goes on his leg. Please advise at [phone].

## 2023-10-21 NOTE — Telephone Encounter (Signed)
 Called patient and reviewed all information. Patient verbalized understanding. Will call if any further questions.

## 2023-10-21 NOTE — Telephone Encounter (Signed)
 It looks like he has been followed by infectious disease and general surgery.  He can call one of their offices to get the appropriate dressings.

## 2023-10-25 ENCOUNTER — Telehealth: Payer: Self-pay

## 2023-10-25 NOTE — Telephone Encounter (Signed)
 I have not seen patient since March 2021 so he would be a new patient again. He can re-establish with me.

## 2023-10-25 NOTE — Telephone Encounter (Signed)
 Copied from CRM [id number]. Topic: Appointments - Appointment Scheduling >> Oct 25, 2023 12:40 PM Aleatha C wrote: Patient recently had surgery at [facility] and his doctor recommended that he has his pre opp appoinment with NP Comer Gaskins, which he had saw last time he had surgery he needs a new appointment within the next 10 days could you call his Mother to see if he can be fit in for appointment [phone]  Call sent to [facility] in error. Please see pt call msg and request

## 2023-10-26 DIAGNOSIS — A4901 Methicillin susceptible Staphylococcus aureus infection, unspecified site: Secondary | ICD-10-CM | POA: Diagnosis not present

## 2023-10-26 DIAGNOSIS — Z89512 Acquired absence of left leg below knee: Secondary | ICD-10-CM | POA: Diagnosis not present

## 2023-10-26 NOTE — Telephone Encounter (Signed)
 Spoke to pt, sch new pt appt on 10/28/23

## 2023-10-28 ENCOUNTER — Ambulatory Visit: Payer: Self-pay | Admitting: Primary Care

## 2023-10-28 DIAGNOSIS — Z1331 Encounter for screening for depression: Secondary | ICD-10-CM | POA: Diagnosis not present

## 2023-10-28 DIAGNOSIS — Z79899 Other long term (current) drug therapy: Secondary | ICD-10-CM | POA: Diagnosis not present

## 2023-10-28 DIAGNOSIS — T8744 Infection of amputation stump, left lower extremity: Secondary | ICD-10-CM | POA: Diagnosis not present

## 2023-10-28 DIAGNOSIS — Z89512 Acquired absence of left leg below knee: Secondary | ICD-10-CM | POA: Diagnosis not present

## 2023-10-28 DIAGNOSIS — A4901 Methicillin susceptible Staphylococcus aureus infection, unspecified site: Secondary | ICD-10-CM | POA: Diagnosis not present

## 2023-10-28 DIAGNOSIS — Z452 Encounter for adjustment and management of vascular access device: Secondary | ICD-10-CM | POA: Diagnosis not present

## 2023-11-01 DIAGNOSIS — T8744 Infection of amputation stump, left lower extremity: Secondary | ICD-10-CM | POA: Diagnosis not present

## 2023-11-01 DIAGNOSIS — Z89512 Acquired absence of left leg below knee: Secondary | ICD-10-CM | POA: Diagnosis not present

## 2023-11-02 DIAGNOSIS — Z89512 Acquired absence of left leg below knee: Secondary | ICD-10-CM | POA: Diagnosis not present

## 2023-11-02 DIAGNOSIS — A4901 Methicillin susceptible Staphylococcus aureus infection, unspecified site: Secondary | ICD-10-CM | POA: Diagnosis not present

## 2023-11-03 DIAGNOSIS — Z89512 Acquired absence of left leg below knee: Secondary | ICD-10-CM | POA: Diagnosis not present

## 2023-11-03 DIAGNOSIS — A4901 Methicillin susceptible Staphylococcus aureus infection, unspecified site: Secondary | ICD-10-CM | POA: Diagnosis not present

## 2023-11-26 DIAGNOSIS — Z1331 Encounter for screening for depression: Secondary | ICD-10-CM | POA: Diagnosis not present

## 2023-11-26 DIAGNOSIS — T8744 Infection of amputation stump, left lower extremity: Secondary | ICD-10-CM | POA: Diagnosis not present

## 2023-11-26 DIAGNOSIS — A4901 Methicillin susceptible Staphylococcus aureus infection, unspecified site: Secondary | ICD-10-CM | POA: Diagnosis not present

## 2023-11-26 DIAGNOSIS — Z9889 Other specified postprocedural states: Secondary | ICD-10-CM | POA: Diagnosis not present

## 2023-11-26 DIAGNOSIS — F419 Anxiety disorder, unspecified: Secondary | ICD-10-CM | POA: Diagnosis not present

## 2023-11-26 DIAGNOSIS — Z87440 Personal history of urinary (tract) infections: Secondary | ICD-10-CM | POA: Diagnosis not present

## 2023-11-26 DIAGNOSIS — M86162 Other acute osteomyelitis, left tibia and fibula: Secondary | ICD-10-CM | POA: Diagnosis not present

## 2023-12-08 ENCOUNTER — Telehealth: Payer: Self-pay

## 2023-12-08 NOTE — Telephone Encounter (Signed)
 Copied from CRM #[id number]. Topic: General - Other >> Dec 08, 2023 10:21 AM Pinkey ORN wrote: Reason for CRM: Home Health Recertification Letter >> Dec 08, 2023 10:23 AM Pinkey ORN wrote: [name] [phone] Called on behalf of patient, wanting to confirm if the fax for patient's recertification letter was received. It was faxed on 12/06/23.

## 2023-12-08 NOTE — Telephone Encounter (Signed)
 This was placed in providers inbox yesterday. Notified Apolin[facility] ppw is awaiting provider review and signature.

## 2023-12-14 DIAGNOSIS — T8744 Infection of amputation stump, left lower extremity: Secondary | ICD-10-CM | POA: Diagnosis not present

## 2023-12-14 DIAGNOSIS — B9561 Methicillin susceptible Staphylococcus aureus infection as the cause of diseases classified elsewhere: Secondary | ICD-10-CM | POA: Diagnosis not present
